# Patient Record
Sex: Female | Born: 1955
Health system: Southern US, Community
[De-identification: ages and names within clinical notes are randomized; demographics above are authoritative.]

## PROBLEM LIST (undated history)

## (undated) DIAGNOSIS — M8580 Other specified disorders of bone density and structure, unspecified site: Secondary | ICD-10-CM

## (undated) DIAGNOSIS — R079 Chest pain, unspecified: Secondary | ICD-10-CM

## (undated) DIAGNOSIS — E7800 Pure hypercholesterolemia, unspecified: Secondary | ICD-10-CM

## (undated) DIAGNOSIS — E559 Vitamin D deficiency, unspecified: Secondary | ICD-10-CM

## (undated) DIAGNOSIS — K219 Gastro-esophageal reflux disease without esophagitis: Secondary | ICD-10-CM

## (undated) DIAGNOSIS — I10 Essential (primary) hypertension: Secondary | ICD-10-CM

## (undated) DIAGNOSIS — M5416 Radiculopathy, lumbar region: Principal | ICD-10-CM

## (undated) HISTORY — DX: Vitamin D deficiency, unspecified: E55.9

## (undated) HISTORY — PX: TONSILLECTOMY: SUR1361

## (undated) HISTORY — PX: URETHRAL DIVERTICULUM REPAIR: SHX5148

## (undated) HISTORY — DX: Other specified disorders of bone density and structure, unspecified site: M85.80

## (undated) HISTORY — PX: TUBAL LIGATION: SHX77

## (undated) HISTORY — DX: Chest pain, unspecified: R07.9

## (undated) HISTORY — DX: Radiculopathy, lumbar region: M54.16

---

## 2010-10-16 LAB — HM DEXA SCAN

## 2012-01-16 ENCOUNTER — Emergency Department (INDEPENDENT_AMBULATORY_CARE_PROVIDER_SITE_OTHER): Payer: No Typology Code available for payment source

## 2012-01-16 ENCOUNTER — Encounter (HOSPITAL_BASED_OUTPATIENT_CLINIC_OR_DEPARTMENT_OTHER): Payer: Self-pay

## 2012-01-16 ENCOUNTER — Emergency Department (HOSPITAL_BASED_OUTPATIENT_CLINIC_OR_DEPARTMENT_OTHER)
Admission: EM | Admit: 2012-01-16 | Discharge: 2012-01-16 | Disposition: A | Discharge status: Home / Self Care | Payer: No Typology Code available for payment source | Attending: Emergency Medicine | Admitting: Emergency Medicine

## 2012-01-16 DIAGNOSIS — K219 Gastro-esophageal reflux disease without esophagitis: Secondary | ICD-10-CM | POA: Insufficient documentation

## 2012-01-16 DIAGNOSIS — S335XXA Sprain of ligaments of lumbar spine, initial encounter: Secondary | ICD-10-CM | POA: Insufficient documentation

## 2012-01-16 DIAGNOSIS — S39012A Strain of muscle, fascia and tendon of lower back, initial encounter: Secondary | ICD-10-CM

## 2012-01-16 DIAGNOSIS — E7800 Pure hypercholesterolemia, unspecified: Secondary | ICD-10-CM | POA: Insufficient documentation

## 2012-01-16 DIAGNOSIS — Y92410 Unspecified street and highway as the place of occurrence of the external cause: Secondary | ICD-10-CM | POA: Insufficient documentation

## 2012-01-16 DIAGNOSIS — M545 Low back pain, unspecified: Secondary | ICD-10-CM | POA: Insufficient documentation

## 2012-01-16 DIAGNOSIS — R935 Abnormal findings on diagnostic imaging of other abdominal regions, including retroperitoneum: Secondary | ICD-10-CM

## 2012-01-16 HISTORY — DX: Pure hypercholesterolemia, unspecified: E78.00

## 2012-01-16 HISTORY — DX: Gastro-esophageal reflux disease without esophagitis: K21.9

## 2012-01-16 MED ORDER — HYDROCODONE-ACETAMINOPHEN 5-500 MG PO TABS: 1.0000 | ORAL_TABLET | Freq: Four times a day (QID) | ORAL | Status: AC | PRN
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
## 2012-01-16 NOTE — Discharge Instructions (Signed)
Back Exercises Back exercises help treat and prevent back injuries. The goal of back exercises is to increase the strength of your abdominal and back muscles and the flexibility of your back. These exercises should be started when you no longer have back pain. Back exercises include:  Pelvic Tilt. Lie on your back with your knees bent. Tilt your pelvis until the lower part of your back is against the floor. Hold this position 5 to 10 sec and repeat 5 to 10 times.   Knee to Chest. Pull first 1 knee up against your chest and hold for 20 to 30 seconds, repeat this with the other knee, and then both knees. This may be done with the other leg straight or bent, whichever feels better.   Sit-Ups or Curl-Ups. Bend your knees 90 degrees. Start with tilting your pelvis, and do a partial, slow sit-up, lifting your trunk only 30 to 45 degrees off the floor. Take at least 2 to 3 seconds for each sit-up. Do not do sit-ups with your knees out straight. If partial sit-ups are difficult, simply do the above but with only tightening your abdominal muscles and holding it as directed.   Hip-Lift. Lie on your back with your knees flexed 90 degrees. Push down with your feet and shoulders as you raise your hips a couple inches off the floor; hold for 10 seconds, repeat 5 to 10 times.   Back arches. Lie on your stomach, propping yourself up on bent elbows. Slowly press on your hands, causing an arch in your low back. Repeat 3 to 5 times. Any initial stiffness and discomfort should lessen with repetition over time.   Shoulder-Lifts. Lie face down with arms beside your body. Keep hips and torso pressed to floor as you slowly lift your head and shoulders off the floor.  Do not overdo your exercises, especially in the beginning. Exercises may cause you some mild back discomfort which lasts for a few minutes; however, if the pain is more severe, or lasts for more than 15 minutes, do not continue exercises until you see your  caregiver. Improvement with exercise therapy for back problems is slow.  See your caregivers for assistance with developing a proper back exercise program. Document Released: 10/15/2004 Document Revised: 08/27/2011 Document Reviewed: 09/07/2005 [employer] Patient Information 2012 [employer]. 

## 2012-01-16 NOTE — ED Provider Notes (Signed)
Medical screening examination/treatment/procedure(s) were performed by non-physician practitioner and as supervising physician I was immediately available for consultation/collaboration.  Doug Sou, MD 01/16/12 1536
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
## 2012-06-08 ENCOUNTER — Emergency Department (HOSPITAL_BASED_OUTPATIENT_CLINIC_OR_DEPARTMENT_OTHER)
Admission: EM | Admit: 2012-06-08 | Discharge: 2012-06-08 | Disposition: A | Discharge status: Home / Self Care | Payer: Self-pay | Attending: Emergency Medicine | Admitting: Emergency Medicine

## 2012-06-08 ENCOUNTER — Encounter (HOSPITAL_BASED_OUTPATIENT_CLINIC_OR_DEPARTMENT_OTHER): Payer: Self-pay

## 2012-06-08 DIAGNOSIS — R188 Other ascites: Secondary | ICD-10-CM | POA: Insufficient documentation

## 2012-06-08 DIAGNOSIS — R0789 Other chest pain: Secondary | ICD-10-CM

## 2012-06-08 DIAGNOSIS — K219 Gastro-esophageal reflux disease without esophagitis: Secondary | ICD-10-CM | POA: Insufficient documentation

## 2012-06-08 LAB — CBC WITH DIFFERENTIAL/PLATELET
Eosinophils Relative: 1 % (ref 0–5)
HCT: 37.2 % (ref 36.0–46.0)
Hemoglobin: 12.5 g/dL (ref 12.0–15.0)
Lymphocytes Relative: 36 % (ref 12–46)
Lymphs Abs: 2.4 10*3/uL (ref 0.7–4.0)
MCV: 78.5 fL (ref 78.0–100.0)
Monocytes Absolute: 0.6 10*3/uL (ref 0.1–1.0)
Monocytes Relative: 9 % (ref 3–12)
Neutro Abs: 3.6 10*3/uL (ref 1.7–7.7)
RBC: 4.74 MIL/uL (ref 3.87–5.11)
RDW: 14.3 % (ref 11.5–15.5)
WBC: 6.6 10*3/uL (ref 4.0–10.5)

## 2012-06-08 LAB — BASIC METABOLIC PANEL
CO2: 26 mEq/L (ref 19–32)
Chloride: 104 mEq/L (ref 96–112)
Creatinine, Ser: 0.8 mg/dL (ref 0.50–1.10)
GFR calc Af Amer: >90 mL/min (ref >90)
Potassium: 3.9 mEq/L (ref 3.5–5.1)

## 2012-06-08 NOTE — ED Provider Notes (Signed)
History     CSN: [id number]  Arrival date & time 06/08/12  1403   First MD Initiated Contact with Patient 06/08/12 1503      Chief Complaint  Patient presents with  . Arm Pain    (Consider location/radiation/quality/duration/timing/severity/associated sxs/prior treatment) HPI  Complains of left arm pain left neck pain and left lateral chest pain upon awakening yesterday morning , described as a mild vague discomfort with " tired feeling"in her left arm discomfort has a pleuritic component, nonexertional not made better or worse by anything denies jaw pain denies shortness of breath nausea or sweatiness. Yesterday at 4 PM she had a sharp pain in her left neck lasting less than 1 minute. Pain is remained bag and minimal since 4 PM yesterday. Nothing makes symptoms better or worse. No treatment prior to coming here Past Medical History  Diagnosis Date  . GERD (gastroesophageal reflux disease)   . Hypercholesteremia    Cardiac risk factors hypercholesterolemia otherwise negative Past Surgical History  Procedure Date  . Urethral diverticulum repair     No family history on file.  History  Substance Use Topics  . Smoking status: Never Smoker   . Smokeless tobacco: Never Used  . Alcohol Use: No    OB History    Grav Para Term Preterm Abortions TAB SAB Ect Mult Living                  Review of Systems  Constitutional: Negative.   HENT: Positive for neck pain.   Respiratory: Negative.   Cardiovascular: Positive for chest pain.  Gastrointestinal: Negative.   Musculoskeletal: Positive for myalgias.  Skin: Negative.   Neurological: Negative.   Hematological: Negative.   Psychiatric/Behavioral: Negative.   All other systems reviewed and are negative.    Allergies  Review of patient's allergies indicates no known allergies.  Home Medications   Current Outpatient Rx  Name Route Sig Dispense Refill  . ZANTAC PO Oral Take by mouth.    Marland Kitchen CALCIUM 1200 PO Oral Take 1  capsule by mouth 3 (three) times daily.    Marland Kitchen VITAMIN D 1000 UNITS PO TABS Oral Take 1,000 Units by mouth 2 (two) times daily.    Marland Kitchen ESOMEPRAZOLE MAGNESIUM 20 MG PO CPDR Oral Take 20 mg by mouth at bedtime.    . OMEGA-3 FATTY ACIDS 1000 MG PO CAPS Oral Take 2 g by mouth 3 (three) times daily.    Marland Kitchen ONE-DAILY MULTI VITAMINS PO TABS Oral Take 1 tablet by mouth daily.    Marland Kitchen SIMVASTATIN 10 MG PO TABS Oral Take 5 mg by mouth at bedtime.      BP 132/48  Pulse 72  Temp 98.6 F (37 C) (Oral)  Resp 18  Ht 5' (1.524 m)  Wt 128 lb (58.06 kg)  BMI 25.00 kg/m2  SpO2 99%  Physical Exam  Nursing note and vitals reviewed. Constitutional: She is oriented to person, place, and time. She appears well-developed and well-nourished. No distress.  HENT:  Head: Normocephalic and atraumatic.  Eyes: Conjunctivae normal are normal. Pupils are equal, round, and reactive to light.  Neck: Neck supple. No tracheal deviation present. No thyromegaly present.       No JVD no bruit  Cardiovascular: Normal rate and regular rhythm.  Exam reveals no friction rub.   No murmur heard. Pulmonary/Chest: Effort normal and breath sounds normal.  Abdominal: Soft. Bowel sounds are normal. She exhibits no distension. There is no tenderness.  Musculoskeletal: Normal range of motion.  She exhibits no edema and no tenderness.  Lymphadenopathy:    She has no cervical adenopathy.  Neurological: She is alert and oriented to person, place, and time. Coordination normal.       Gait normal Romberg normal pronator drift normal. Motor strength 5 over 5 overall  Skin: Skin is warm and dry. No rash noted.  Psychiatric: She has a normal mood and affect.    ED Course  Procedures (including critical care time)  Labs Reviewed - No data to display No results found.   No diagnosis found.    Date: 06/08/2012  Rate: 60  Rhythm: sinus bradycardia  QRS Axis: normal  Intervals: normal  ST/T Wave abnormalities: nonspecific T wave changes   Conduction Disutrbances:none  Narrative Interpretation:   Old EKG Reviewed: none available  MDM   Doubt cardiac etiology i.e. highly atypical symptoms, near normal EKG negative troponin after greater than 24 hours of symptoms. Doubt pulmonary embolism no shortness of breath highly atypical symptoms negative d-dimer, doubt aortic dissection no pulse deficit atypical symptoms Spoke with Dr.Sprye plan  Lab results and EKG be faxed to her office patient to call office tomorrow for close followup  Diagnosis atypical chest pain     Doug Sou, MD 06/08/12 1608

## 2012-06-08 NOTE — ED Notes (Signed)
MD at bedside. 

## 2012-06-08 NOTE — ED Notes (Signed)
C/o pain to left upper back pain, left arm left side of jaw and neck-started yesterday at work

## 2012-06-08 NOTE — Discharge Instructions (Signed)
Call Dr. Carolyne Fiscal tomorrow to schedule the next available office appointment. Your test results have been faxed to her office. Return if concern for any reason between now and the time you see Dr. Carolyne Fiscal

## 2013-12-07 LAB — HM MAMMOGRAPHY: HM Mammogram: NORMAL

## 2014-09-21 LAB — HM PAP SMEAR: HM PAP: NORMAL

## 2015-10-14 ENCOUNTER — Emergency Department (HOSPITAL_BASED_OUTPATIENT_CLINIC_OR_DEPARTMENT_OTHER)
Admission: EM | Admit: 2015-10-14 | Discharge: 2015-10-14 | Disposition: A | Discharge status: Home / Self Care | Payer: BC Managed Care – PPO | Attending: Emergency Medicine | Admitting: Emergency Medicine

## 2015-10-14 ENCOUNTER — Encounter (HOSPITAL_BASED_OUTPATIENT_CLINIC_OR_DEPARTMENT_OTHER): Payer: Self-pay

## 2015-10-14 DIAGNOSIS — M545 Low back pain, unspecified: Secondary | ICD-10-CM

## 2015-10-14 DIAGNOSIS — E7800 Pure hypercholesterolemia, unspecified: Secondary | ICD-10-CM | POA: Diagnosis not present

## 2015-10-14 DIAGNOSIS — Z79899 Other long term (current) drug therapy: Secondary | ICD-10-CM | POA: Insufficient documentation

## 2015-10-14 DIAGNOSIS — K219 Gastro-esophageal reflux disease without esophagitis: Secondary | ICD-10-CM | POA: Diagnosis not present

## 2015-10-14 MED ORDER — NAPROXEN 500 MG PO TABS: 500.0000 mg | ORAL_TABLET | Freq: Two times a day (BID) | ORAL | Status: DC

## 2015-10-14 MED ORDER — OXYCODONE-ACETAMINOPHEN 5-325 MG PO TABS: 1.0000 | ORAL_TABLET | Freq: Four times a day (QID) | ORAL | Status: DC | PRN

## 2015-10-14 MED ORDER — DIAZEPAM 5 MG PO TABS
5.0000 mg | ORAL_TABLET | Freq: Once | ORAL | Status: AC
  Administered 2015-10-14: 5 mg via ORAL
  Filled 2015-10-14: qty 1

## 2015-10-14 MED ORDER — OXYCODONE-ACETAMINOPHEN 5-325 MG PO TABS
1.0000 | ORAL_TABLET | Freq: Once | ORAL | Status: AC
  Administered 2015-10-14: 1 via ORAL
  Filled 2015-10-14: qty 1

## 2015-10-14 MED ORDER — KETOROLAC TROMETHAMINE 30 MG/ML IJ SOLN
30.0000 mg | Freq: Once | INTRAMUSCULAR | Status: AC
  Administered 2015-10-14: 30 mg via INTRAMUSCULAR
  Filled 2015-10-14: qty 1

## 2015-10-14 MED ORDER — CYCLOBENZAPRINE HCL 5 MG PO TABS: 5.0000 mg | ORAL_TABLET | Freq: Three times a day (TID) | ORAL | Status: DC | PRN
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
## 2015-10-14 NOTE — Discharge Instructions (Signed)
Return to the ED with any concerns including weakness of legs, not able to urinate, loss of control of bowel or bladder, fever/chills, decreased level of alertness/lethargy, or any other alarming symptoms °

## 2015-10-14 NOTE — ED Provider Notes (Signed)
CSN: [id number]     Arrival date & time 10/14/15  1912 History   First MD Initiated Contact with Patient 10/14/15 2136     Chief Complaint  Patient presents with  . Back Pain     (Consider location/radiation/quality/duration/timing/severity/associated sxs/prior Treatment) HPI  Pt presenting with c/o low back pain.  Pt states pain began yesterday and was worse on the right side.  She used topical medication and laid down- when she got up today the pain was on the left side.  Worse with movement and palpation.  She has had left lower back pain for the past several months that her doctor attributed to repetitive movements.  The pain yesterday and today is more intense. No urinary retention or incontinence.  No weakness of legs.  Movement is restricted by pain.  No fever/chills.  There are no other associated systemic symptoms, there are no other alleviating or modifying factors.   Past Medical History  Diagnosis Date  . GERD (gastroesophageal reflux disease)   . Hypercholesteremia    Past Surgical History  Procedure Laterality Date  . Urethral diverticulum repair     No family history on file. Social History  Substance Use Topics  . Smoking status: Never Smoker   . Smokeless tobacco: Never Used  . Alcohol Use: No   OB History    No data available     Review of Systems  ROS reviewed and all otherwise negative except for mentioned in HPI    Allergies  Review of patient's allergies indicates no known allergies.  Home Medications   Prior to Admission medications   Medication Sig Start Date End Date Taking? Authorizing Provider  Calcium Carbonate-Vit D-Min (CALCIUM 1200 PO) Take 1 capsule by mouth 3 (three) times daily.   Yes Historical Provider, MD  cholecalciferol (VITAMIN D) 1000 UNITS tablet Take 1,000 Units by mouth 2 (two) times daily.   Yes Historical Provider, MD  esomeprazole (NEXIUM) 20 MG capsule Take 20 mg by mouth at bedtime.   Yes Historical Provider, MD  fish  oil-omega-3 fatty acids 1000 MG capsule Take 2 g by mouth 3 (three) times daily.   Yes Historical Provider, MD  Multiple Vitamin (MULTIVITAMIN) tablet Take 1 tablet by mouth daily.   Yes Historical Provider, MD  Ranitidine HCl (ZANTAC PO) Take 1 tablet by mouth daily as needed. For heartburn.   Yes Historical Provider, MD  simvastatin (ZOCOR) 10 MG tablet Take 5 mg by mouth at bedtime.   Yes Historical Provider, MD  cyclobenzaprine (FLEXERIL) 5 MG tablet Take 1 tablet (5 mg total) by mouth 3 (three) times daily as needed for muscle spasms. 10/14/15   Jerelyn Scott, MD  naproxen (NAPROSYN) 500 MG tablet Take 1 tablet (500 mg total) by mouth 2 (two) times daily. 10/14/15   Jerelyn Scott, MD  oxyCODONE-acetaminophen (PERCOCET/ROXICET) 5-325 MG tablet Take 1-2 tablets by mouth every 6 (six) hours as needed for severe pain. 10/14/15   Jerelyn Scott, MD  predniSONE (STERAPRED UNI-PAK 21 TAB) 10 MG (21) TBPK tablet Take 1 tablet (10 mg total) by mouth daily. Take 6 tabs by mouth daily  for 2 days, then 5 tabs for 2 days, then 4 tabs for 2 days, then 3 tabs for 2 days, 2 tabs for 2 days, then 1 tab by mouth daily for 2 days 10/17/15   Kristen N Ward, DO   BP 124/41 mmHg  Pulse 95  Temp(Src) 98.2 F (36.8 C) (Oral)  Resp 16  Ht 5' (1.524  m)  Wt 60.782 kg  BMI 26.17 kg/m2  SpO2 97%  Vitals reviewed Physical Exam  Physical Examination: General appearance - alert, well appearing, and in no distress Mental status - alert, oriented to person, place, and time Eyes -no conjunctival injection, no scleral icterus Chest - clear to auscultation, no wheezes, rales or rhonchi, symmetric air entry Heart - normal rate, regular rhythm, normal S1, S2, no murmurs, rubs, clicks or gallops Abdomen - soft, nontender, nondistended, no masses or organomegaly Back exam - no midline tenderness, ttp over left lower and left thoracic paraspinous muscles Neurological - alert, oriented, normal speech, strength 5/5 in lower  extremities bilaterally, sensation intact Extremities - peripheral pulses normal, no pedal edema, no clubbing or cyanosis Skin - normal coloration and turgor, no rashes  ED Course  Procedures (including critical care time) Labs Review Labs Reviewed - No data to display  Imaging Review No results found. I have personally reviewed and evaluated these images and lab results as part of my medical decision-making.   EKG Interpretation None      MDM   Final diagnoses:  Left-sided low back pain without sciatica    Pt presenting with c/o left sided low back pain.  Pain with movement and palpation.  No signs or symptoms of cauda equina, no fever to suggest epidural abscess.  No midline tenderness to palpation- imaging not indicated.  Pt feels improved after toradol injection, percocet and valium.  Pt given rx for flexeril, naproxen adn percocet for pain.  Discharged with strict return precautions.  Pt agreeable with plan.    Jerelyn Scott, MD 10/17/15 [phone]

## 2015-10-14 NOTE — ED Notes (Signed)
Left lower back pain for the last several months, worse today, saw PCP and was told it was from repetitive movements.  No dysuria, no n/v/d, pain worse with movement

## 2015-10-15 ENCOUNTER — Telehealth: Payer: Self-pay | Admitting: Family Medicine

## 2015-10-15 NOTE — Telephone Encounter (Signed)
Caller name: Wallace,Letitia A  Relation to pt: niece  Call back number:[phone]   Reason for call:  Wallace,Letitia A patient of yours referring her aunt to establish care with you, patient is aware your relocating. Please advise

## 2015-10-15 NOTE — Telephone Encounter (Signed)
Ok to establish 

## 2015-10-16 NOTE — Telephone Encounter (Signed)
Awaiting call back to schedule.

## 2015-10-16 NOTE — Telephone Encounter (Signed)
Pt has new pt appt scheduled w/ Ramon Dredge tomorrow (10/15/14). Is pt establishing w/ Dr. Beverely Low or Ramon Dredge? Please advise.

## 2015-10-16 NOTE — Telephone Encounter (Signed)
Patient was seen in the ED 10/14/2015 and was advised to follow up with a PCP. Dr. Beverely Low does not have any 30 minute appointment slots therefore scheduled with Ramon Dredge but Dr. Beverely Low will be her PCP.

## 2015-10-16 NOTE — Telephone Encounter (Signed)
Dr. Beverely Low approved seeing patient for ED follow up Friday at 11am.

## 2015-10-16 NOTE — Telephone Encounter (Signed)
Per Dr. Beverely Low: Pt can keep appt tomorrow w/ Ramon Dredge if she feels like she needs to be seen tomorrow, but please change appt type to ED f/u. If she sees [facility], she will still need to schedule New Patient appt w/ Dr. Beverely Low.  If patient is willing to wait until Friday, cancel appt w/ Ramon Dredge and schedule her for 30 min ED f/u w/ Dr. Beverely Low.

## 2015-10-17 ENCOUNTER — Emergency Department (HOSPITAL_BASED_OUTPATIENT_CLINIC_OR_DEPARTMENT_OTHER)
Admission: EM | Admit: 2015-10-17 | Discharge: 2015-10-17 | Disposition: A | Discharge status: Home / Self Care | Payer: BC Managed Care – PPO | Attending: Emergency Medicine | Admitting: Emergency Medicine

## 2015-10-17 ENCOUNTER — Encounter: Payer: Self-pay | Admitting: *Deleted

## 2015-10-17 ENCOUNTER — Ambulatory Visit: Payer: BC Managed Care – PPO | Admitting: Medical

## 2015-10-17 ENCOUNTER — Encounter (HOSPITAL_BASED_OUTPATIENT_CLINIC_OR_DEPARTMENT_OTHER): Payer: Self-pay

## 2015-10-17 ENCOUNTER — Telehealth: Payer: Self-pay | Admitting: *Deleted

## 2015-10-17 DIAGNOSIS — K219 Gastro-esophageal reflux disease without esophagitis: Secondary | ICD-10-CM | POA: Insufficient documentation

## 2015-10-17 DIAGNOSIS — Y92009 Unspecified place in unspecified non-institutional (private) residence as the place of occurrence of the external cause: Secondary | ICD-10-CM | POA: Diagnosis not present

## 2015-10-17 DIAGNOSIS — Y998 Other external cause status: Secondary | ICD-10-CM | POA: Insufficient documentation

## 2015-10-17 DIAGNOSIS — W92XXXA Exposure to excessive heat of man-made origin, initial encounter: Secondary | ICD-10-CM | POA: Diagnosis not present

## 2015-10-17 DIAGNOSIS — T2123XA Burn of second degree of upper back, initial encounter: Secondary | ICD-10-CM | POA: Insufficient documentation

## 2015-10-17 DIAGNOSIS — M5442 Lumbago with sciatica, left side: Secondary | ICD-10-CM | POA: Diagnosis not present

## 2015-10-17 DIAGNOSIS — E7800 Pure hypercholesterolemia, unspecified: Secondary | ICD-10-CM | POA: Insufficient documentation

## 2015-10-17 DIAGNOSIS — M545 Low back pain: Secondary | ICD-10-CM | POA: Diagnosis present

## 2015-10-17 DIAGNOSIS — Z79899 Other long term (current) drug therapy: Secondary | ICD-10-CM | POA: Insufficient documentation

## 2015-10-17 DIAGNOSIS — Y9389 Activity, other specified: Secondary | ICD-10-CM | POA: Insufficient documentation

## 2015-10-17 DIAGNOSIS — Z791 Long term (current) use of non-steroidal anti-inflammatories (NSAID): Secondary | ICD-10-CM | POA: Diagnosis not present

## 2015-10-17 DIAGNOSIS — T2124XA Burn of second degree of lower back, initial encounter: Secondary | ICD-10-CM

## 2015-10-17 MED ORDER — PREDNISONE 50 MG PO TABS
60.0000 mg | ORAL_TABLET | Freq: Once | ORAL | Status: AC
  Administered 2015-10-17: 60 mg via ORAL
  Filled 2015-10-17: qty 1

## 2015-10-17 MED ORDER — SILVER SULFADIAZINE 1 % EX CREA
TOPICAL_CREAM | Freq: Once | CUTANEOUS | Status: AC
  Administered 2015-10-17: 1 via TOPICAL
  Filled 2015-10-17: qty 85

## 2015-10-17 MED ORDER — PREDNISONE 10 MG (21) PO TBPK: 10.0000 mg | ORAL_TABLET | Freq: Every day | ORAL | Status: DC

## 2015-10-17 MED ORDER — KETOROLAC TROMETHAMINE 30 MG/ML IJ SOLN
30.0000 mg | Freq: Once | INTRAMUSCULAR | Status: AC
  Administered 2015-10-17: 30 mg via INTRAMUSCULAR
  Filled 2015-10-17: qty 1

## 2015-10-17 MED ORDER — MORPHINE SULFATE (PF) 4 MG/ML IV SOLN
4.0000 mg | Freq: Once | INTRAVENOUS | Status: AC
  Administered 2015-10-17: 4 mg via INTRAMUSCULAR
  Filled 2015-10-17: qty 1

## 2015-10-17 MED ORDER — OXYCODONE-ACETAMINOPHEN 5-325 MG PO TABS
2.0000 | ORAL_TABLET | Freq: Once | ORAL | Status: AC
  Administered 2015-10-17: 2 via ORAL
  Filled 2015-10-17: qty 2

## 2015-10-17 MED FILL — NAPROXEN 500 MG TABLET: 500 | 15 days supply | Qty: 30 | Fill #0

## 2015-10-17 MED FILL — CYCLOBENZAPRINE 5 MG TABLET: 5 | 7 days supply | Qty: 20 | Fill #0

## 2015-10-17 MED FILL — OXYCODONE/APAP 5-325: 5-325 | 2 days supply | Qty: 15 | Fill #0

## 2015-10-17 MED FILL — predniSONE 10 MG TABS: 10 | 12 days supply | Qty: 42 | Fill #0

## 2015-10-17 NOTE — ED Notes (Addendum)
Pt seen on Monday, dx'd with back pain, been going on for several weeks, states it is getting worse and radiating down her left leg, no incontinence, no groin numbness

## 2015-10-17 NOTE — Telephone Encounter (Signed)
Pre-Visit Call completed with patient and chart updated.   Pre-Visit Info documented in Specialty Comments under [employer].    
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
## 2015-10-17 NOTE — Telephone Encounter (Signed)
Unable to reach patient at time of pre-visit call. Left message for patient to return call when available.  

## 2015-10-18 ENCOUNTER — Ambulatory Visit (INDEPENDENT_AMBULATORY_CARE_PROVIDER_SITE_OTHER): Payer: BC Managed Care – PPO | Admitting: Family Medicine

## 2015-10-18 ENCOUNTER — Encounter: Payer: Self-pay | Admitting: Family Medicine

## 2015-10-18 VITALS — BP 118/80 | HR 95 | Temp 97.8°F | Ht 60.5 in | Wt 135.8 lb

## 2015-10-18 DIAGNOSIS — T2124XA Burn of second degree of lower back, initial encounter: Secondary | ICD-10-CM | POA: Diagnosis not present

## 2015-10-18 DIAGNOSIS — M5416 Radiculopathy, lumbar region: Secondary | ICD-10-CM

## 2015-10-18 HISTORY — DX: Radiculopathy, lumbar region: M54.16

## 2015-10-18 HISTORY — DX: Burn of second degree of lower back, initial encounter: T21.24XA

## 2015-10-18 MED ORDER — MUPIROCIN 2 % EX OINT: 1.0000 "application " | TOPICAL_OINTMENT | Freq: Two times a day (BID) | CUTANEOUS | Status: DC
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
## 2015-10-18 NOTE — Progress Notes (Signed)
Pre visit review using our clinic review tool, if applicable. No additional management support is needed unless otherwise documented below in the visit note. 
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
## 2015-10-19 ENCOUNTER — Encounter (HOSPITAL_COMMUNITY): Payer: Self-pay | Admitting: Emergency Medicine

## 2015-10-19 ENCOUNTER — Observation Stay (HOSPITAL_COMMUNITY)
Admission: EM | Admit: 2015-10-19 | Discharge: 2015-10-20 | Disposition: A | Discharge status: Home / Self Care | Payer: BC Managed Care – PPO | Attending: Internal Medicine | Admitting: Internal Medicine

## 2015-10-19 ENCOUNTER — Emergency Department (HOSPITAL_COMMUNITY): Payer: BC Managed Care – PPO

## 2015-10-19 DIAGNOSIS — D72829 Elevated white blood cell count, unspecified: Secondary | ICD-10-CM | POA: Diagnosis not present

## 2015-10-19 DIAGNOSIS — Z79899 Other long term (current) drug therapy: Secondary | ICD-10-CM | POA: Diagnosis not present

## 2015-10-19 DIAGNOSIS — E559 Vitamin D deficiency, unspecified: Secondary | ICD-10-CM | POA: Insufficient documentation

## 2015-10-19 DIAGNOSIS — M8580 Other specified disorders of bone density and structure, unspecified site: Secondary | ICD-10-CM | POA: Diagnosis not present

## 2015-10-19 DIAGNOSIS — R072 Precordial pain: Secondary | ICD-10-CM

## 2015-10-19 DIAGNOSIS — Z7952 Long term (current) use of systemic steroids: Secondary | ICD-10-CM | POA: Diagnosis not present

## 2015-10-19 DIAGNOSIS — Z791 Long term (current) use of non-steroidal anti-inflammatories (NSAID): Secondary | ICD-10-CM | POA: Insufficient documentation

## 2015-10-19 DIAGNOSIS — M5416 Radiculopathy, lumbar region: Secondary | ICD-10-CM | POA: Insufficient documentation

## 2015-10-19 DIAGNOSIS — R079 Chest pain, unspecified: Secondary | ICD-10-CM

## 2015-10-19 DIAGNOSIS — E785 Hyperlipidemia, unspecified: Secondary | ICD-10-CM | POA: Diagnosis not present

## 2015-10-19 DIAGNOSIS — T300 Burn of unspecified body region, unspecified degree: Secondary | ICD-10-CM

## 2015-10-19 DIAGNOSIS — K219 Gastro-esophageal reflux disease without esophagitis: Secondary | ICD-10-CM

## 2015-10-19 DIAGNOSIS — I10 Essential (primary) hypertension: Secondary | ICD-10-CM | POA: Insufficient documentation

## 2015-10-19 HISTORY — DX: Burn of unspecified body region, unspecified degree: T30.0

## 2015-10-19 HISTORY — DX: Elevated white blood cell count, unspecified: D72.829

## 2015-10-19 HISTORY — DX: Essential (primary) hypertension: I10

## 2015-10-19 HISTORY — DX: Chest pain, unspecified: R07.9

## 2015-10-19 HISTORY — DX: Hyperlipidemia, unspecified: E78.5

## 2015-10-19 LAB — BASIC METABOLIC PANEL
Anion gap: 9 (ref 5–15)
BUN: 17 mg/dL (ref 6–20)
CHLORIDE: 108 mmol/L (ref 101–111)
CO2: 26 mmol/L (ref 22–32)
Calcium: 8.9 mg/dL (ref 8.9–10.3)
Creatinine, Ser: 0.82 mg/dL (ref 0.44–1.00)
Glucose, Bld: 88 mg/dL (ref 65–99)
POTASSIUM: 4.7 mmol/L (ref 3.5–5.1)
SODIUM: 143 mmol/L (ref 135–145)

## 2015-10-19 LAB — TROPONIN I
Troponin I: <0.03 ng/mL (ref <0.031)
Troponin I: <0.03 ng/mL (ref <0.031)

## 2015-10-19 LAB — CBC
HEMATOCRIT: 35.2 % — AB (ref 36.0–46.0)
Hemoglobin: 11.4 g/dL — ABNORMAL LOW (ref 12.0–15.0)
MCH: 26.3 pg (ref 26.0–34.0)
MCHC: 32.4 g/dL (ref 30.0–36.0)
MCV: 81.3 fL (ref 78.0–100.0)
PLATELETS: 238 10*3/uL (ref 150–400)
RBC: 4.33 MIL/uL (ref 3.87–5.11)
RDW: 14.3 % (ref 11.5–15.5)
WBC: 14 10*3/uL — AB (ref 4.0–10.5)

## 2015-10-19 LAB — I-STAT TROPONIN, ED
TROPONIN I, POC: 0 ng/mL (ref 0.00–0.08)
Troponin i, poc: 0 ng/mL (ref 0.00–0.08)

## 2015-10-19 LAB — PROTIME-INR
INR: 1.09 (ref 0.00–1.49)
Prothrombin Time: 14.3 seconds (ref 11.6–15.2)

## 2015-10-19 LAB — APTT: APTT: 29 s (ref 24–37)

## 2015-10-19 MED ORDER — PREDNISONE 10 MG PO TABS
10.0000 mg | ORAL_TABLET | Freq: Every day | ORAL | Status: DC
  Administered 2015-10-19 – 2015-10-20 (×2): 10 mg via ORAL
  Filled 2015-10-19 (×2): qty 1

## 2015-10-19 MED ORDER — CYCLOBENZAPRINE HCL 10 MG PO TABS: 5.0000 mg | ORAL_TABLET | Freq: Three times a day (TID) | ORAL | Status: DC | PRN

## 2015-10-19 MED ORDER — ATORVASTATIN CALCIUM 80 MG PO TABS
80.0000 mg | ORAL_TABLET | Freq: Every day | ORAL | Status: DC
  Administered 2015-10-19: 80 mg via ORAL
  Filled 2015-10-19: qty 1

## 2015-10-19 MED ORDER — ENOXAPARIN SODIUM 40 MG/0.4ML ~~LOC~~ SOLN
40.0000 mg | SUBCUTANEOUS | Status: DC
  Administered 2015-10-19: 40 mg via SUBCUTANEOUS
  Filled 2015-10-19: qty 0.4

## 2015-10-19 MED ORDER — NITROGLYCERIN 0.4 MG SL SUBL
0.4000 mg | SUBLINGUAL_TABLET | SUBLINGUAL | Status: DC | PRN
  Administered 2015-10-19 (×2): 0.4 mg via SUBLINGUAL
  Filled 2015-10-19: qty 1

## 2015-10-19 MED ORDER — MORPHINE SULFATE (PF) 2 MG/ML IV SOLN: 2.0000 mg | INTRAVENOUS | Status: DC | PRN

## 2015-10-19 MED ORDER — GI COCKTAIL ~~LOC~~: 30.0000 mL | Freq: Four times a day (QID) | ORAL | Status: DC | PRN

## 2015-10-19 MED ORDER — ASPIRIN EC 81 MG PO TBEC
81.0000 mg | DELAYED_RELEASE_TABLET | Freq: Every day | ORAL | Status: DC
  Administered 2015-10-20: 81 mg via ORAL
  Filled 2015-10-19: qty 1

## 2015-10-19 MED ORDER — PREDNISONE 10 MG (21) PO TBPK: 10.0000 mg | ORAL_TABLET | Freq: Every day | ORAL | Status: DC

## 2015-10-19 MED ORDER — ACETAMINOPHEN 325 MG PO TABS: 650.0000 mg | ORAL_TABLET | ORAL | Status: DC | PRN

## 2015-10-19 MED ORDER — OXYCODONE-ACETAMINOPHEN 5-325 MG PO TABS: 1.0000 | ORAL_TABLET | Freq: Four times a day (QID) | ORAL | Status: DC | PRN

## 2015-10-19 MED ORDER — FAMOTIDINE 20 MG PO TABS: 20.0000 mg | ORAL_TABLET | Freq: Every day | ORAL | Status: DC | PRN

## 2015-10-19 MED ORDER — MUPIROCIN 2 % EX OINT
1.0000 "application " | TOPICAL_OINTMENT | Freq: Two times a day (BID) | CUTANEOUS | Status: DC
  Administered 2015-10-20: 1 via TOPICAL
  Filled 2015-10-19: qty 22

## 2015-10-19 MED ORDER — ONDANSETRON HCL 4 MG/2ML IJ SOLN: 4.0000 mg | Freq: Four times a day (QID) | INTRAMUSCULAR | Status: DC | PRN

## 2015-10-19 MED ORDER — SIMVASTATIN 10 MG PO TABS: 5.0000 mg | ORAL_TABLET | Freq: Every day | ORAL | Status: DC

## 2015-10-19 NOTE — ED Provider Notes (Signed)
Medical screening examination/treatment/procedure(s) were conducted as a shared visit with non-physician practitioner(s) and myself.  I personally evaluated the patient during the encounter.   EKG Interpretation   Date/Time:  Saturday October 19 2015 10:58:44 EST Ventricular Rate:  61 PR Interval:  138 QRS Duration: 82 QT Interval:  343 QTC Calculation: 345 R Axis:   76 Text Interpretation:  Sinus rhythm Nonspecific T abnrm, anterolateral  leads Confirmed by Lakeena Downie  MD, Brynja Marker ([id number]) on 10/19/2015 11:19:41 AM       Results for orders placed or performed during the hospital encounter of 10/19/15  Basic metabolic panel  Result Value Ref Range   Sodium 143 135 - 145 mmol/L   Potassium 4.7 3.5 - 5.1 mmol/L   Chloride 108 101 - 111 mmol/L   CO2 26 22 - 32 mmol/L   Glucose, Bld 88 65 - 99 mg/dL   BUN 17 6 - 20 mg/dL   Creatinine, Ser 1.61 0.44 - 1.00 mg/dL   Calcium 8.9 8.9 - 09.6 mg/dL   GFR calc non Af Amer >60 >60 mL/min   GFR calc Af Amer >60 >60 mL/min   Anion gap 9 5 - 15  CBC  Result Value Ref Range   WBC 14.0 (H) 4.0 - 10.5 K/uL   RBC 4.33 3.87 - 5.11 MIL/uL   Hemoglobin 11.4 (L) 12.0 - 15.0 g/dL   HCT 04.5 (L) 40.9 - 81.1 %   MCV 81.3 78.0 - 100.0 fL   MCH 26.3 26.0 - 34.0 pg   MCHC 32.4 30.0 - 36.0 g/dL   RDW 91.4 78.2 - 95.6 %   Platelets 238 150 - 400 K/uL  I-stat troponin, ED (not at [facility], [facility])  Result Value Ref Range   Troponin i, poc 0.00 0.00 - 0.08 ng/mL   Comment 3           Dg Chest 2 View  10/19/2015  CLINICAL DATA:  60 year old female with acute chest pain today. EXAM: CHEST  2 VIEW COMPARISON:  None. FINDINGS: The cardiomediastinal silhouette is unremarkable. There is no evidence of focal airspace disease, pulmonary edema, suspicious pulmonary nodule/mass, pleural effusion, or pneumothorax. No acute bony abnormalities are identified. IMPRESSION: No active cardiopulmonary disease. Electronically Signed   By: Harmon Pier M.D.   On: 10/19/2015 12:23     Patient seen by me. Patient with 2 episodes of substernal chest pain yesterday that lasted 3-5 minutes. This morning the pain reoccurred at 9 in the morning and has not gone away completely since then. But this time the pain was more right side and radiated into the right jaw. Patient describes it as pressure and sometimes more sharp pain. The sharp pains have been intermittent but since 9:00 this morning she's had constant discomfort. Workup first troponin negative no significant anemia no significant electrolyte abnormalities. Chest x-rays negative for any active cardiopulmonary disease no pneumonia no pneumothorax. EKG without any acute changes other some nonspecific T-wave abnormalities. Patient on a hard score of comes in at 85. Claris Gower is at moderate risk and that on top of the persistent pain since 9 this morning we'll mandates admission for rule out. On exam heart regular rate and rhythm lungs are clear bilaterally chest is nontender abdomen is nontender.  Vanetta Mulders, MD 10/19/15 1423

## 2015-10-19 NOTE — ED Notes (Addendum)
To ED via [facility] from home with c/o chest pain that started at around midnight , intermittent, states "feels like someone is standing on me" . No nausea/no vomiting.  Pt received ASA  and NTG x1

## 2015-10-19 NOTE — ED Notes (Signed)
Pain is still 3/10 --

## 2015-10-19 NOTE — H&P (Signed)
[facility] History and Physical  Connie Gross WUJ:[id number] DOB: 04-03-1956 DOA: 10/19/2015  Referring physician: Emergency Department PCP: Neena Rhymes, MD   CHIEF COMPLAINT:   Chest pain   HPI: Connie Gross is a 60 y.o. female, relatively healthy, presenting to the emergency department today for chest pain. Over the last year patient has had several episodes of nonexertional, nonradiating chest pain. Pain described as burning . Episodes last usually 5-10 minutes and resolve spontaneously. Patient works in her large yard and does other physical activity without any occurrences of chest pain or shortness of breath. Episodes always occur at rest.   Yesterday patient developed recurrent chest pain different than previous episodes. This time the chest pain was more of a pressure with radiation up into right jaw. No radiation down arms. No associated diaphoresis or shortness of breath . Patient saw her PCP yesterday , note is pending but per patient an EKG was done and did show some slight changes. Patient was referred to a cardiologist in [facility] Feb 4th.  Patient does have a history of GERD, she is on an H2 blocker.    Chest pain not totally resolved, maybe 4/10 right now. Got sl NTG 30 minutes ago. Second dose just being given now   ED COURSE:   Given NTG SL x 2       Labs:   Troponin 0.0 x2,  normal renal function WBC 14, hemoglobin 11.4  CXR:  No acute abnormalities           EKG:    Sinus rhythm Nonspecific T abnrm, anterolateral leads                  Medications  nitroGLYCERIN (NITROSTAT) SL tablet 0.4 mg (0.4 mg Sublingual Given 10/19/15 1505)    Review of Systems  Constitutional: Negative.   HENT: Negative.   Eyes: Negative.   Respiratory: Negative.   Cardiovascular: Positive for chest pain.  Gastrointestinal: Negative.   Genitourinary: Negative.   Musculoskeletal: Negative.   Skin: Negative.   Neurological: Negative.   Psychiatric/Behavioral:  Negative.    Past Medical History  Diagnosis Date  . Hypercholesteremia   . Vitamin D deficiency   . Osteopenia   . GERD (gastroesophageal reflux disease)   . Lumbar radiculopathy 10/18/2015   Past Surgical History  Procedure Laterality Date  . Urethral diverticulum repair    . Tubal ligation    . Tonsillectomy      SOCIAL HISTORY:  reports that she has never smoked. She has never used smokeless tobacco. She reports that she does not drink alcohol or use illicit drugs. Lives: at home along     Assistive devices:   None needed for ambulation.   No Known Allergies  Family History  Problem Relation Age of Onset  . Heart disease Father   . Alzheimer's disease Father   . Cancer Sister   . Hodgkin's lymphoma Sister   . Stomach cancer Maternal Grandmother   . Osteoporosis Brother     Prior to Admission medications   Medication Sig Start Date End Date Taking? Authorizing Provider  Calcium Carbonate-Vit D-Min (CALCIUM 1200 PO) Take 1 capsule by mouth 3 (three) times daily.   Yes Historical Provider, MD  cholecalciferol (VITAMIN D) 1000 UNITS tablet Take 1,000 Units by mouth 2 (two) times daily.   Yes Historical Provider, MD  cyclobenzaprine (FLEXERIL) 5 MG tablet Take 1 tablet (5 mg total) by mouth 3 (three) times daily as needed for muscle spasms. 10/14/15  Yes Jerelyn Scott, MD  fish oil-omega-3 fatty acids 1000 MG capsule Take 2 g by mouth daily as needed.    Yes Historical Provider, MD  Multiple Vitamin (MULTIVITAMIN) tablet Take 1 tablet by mouth daily. Reported on 10/17/2015   Yes Historical Provider, MD  mupirocin ointment (BACTROBAN) 2 % Apply 1 application topically 2 (two) times daily. 10/18/15  Yes Sheliah Hatch, MD  naproxen (NAPROSYN) 500 MG tablet Take 1 tablet (500 mg total) by mouth 2 (two) times daily. 10/14/15  Yes Jerelyn Scott, MD  oxyCODONE-acetaminophen (PERCOCET/ROXICET) 5-325 MG tablet Take 1-2 tablets by mouth every 6 (six) hours as needed for severe pain.  10/14/15  Yes Jerelyn Scott, MD  predniSONE (STERAPRED UNI-PAK 21 TAB) 10 MG (21) TBPK tablet Take 1 tablet (10 mg total) by mouth daily. Take 6 tabs by mouth daily  for 2 days, then 5 tabs for 2 days, then 4 tabs for 2 days, then 3 tabs for 2 days, 2 tabs for 2 days, then 1 tab by mouth daily for 2 days 10/17/15  Yes Kristen N Ward, DO  Ranitidine HCl (ZANTAC PO) Take 1 tablet by mouth daily as needed. For heartburn.   Yes Historical Provider, MD  simvastatin (ZOCOR) 10 MG tablet Take 5 mg by mouth at bedtime.   Yes Historical Provider, MD   PHYSICAL EXAM: Filed Vitals:   10/19/15 1055 10/19/15 1245 10/19/15 1343 10/19/15 1445  BP: 147/70 126/66 129/79 133/69  Pulse: 93 64 75 79  Temp: 98.5 F (36.9 C)     TempSrc: Oral     Resp: Height: 5' (1.524 m)     Weight: 61.236 kg (135 lb)     SpO2: 98% 100% 100% 100%    Wt Readings from Last 3 Encounters:  10/19/15 61.236 kg (135 lb)  10/18/15 61.598 kg (135 lb 12.8 oz)  10/14/15 60.782 kg (134 lb)    General:  Pleasant black  female. Appears calm and comfortable Eyes: PER, normal lids, irises & conjunctiva ENT: grossly normal hearing, lips & tongue Neck: no LAD, no masses Cardiovascular: RRR, no murmurs. No LE edema.  Respiratory: Respirations even and unlabored. Normal respiratory effort. Lungs CTA bilaterally, no wheezes / rales .   Abdomen: soft, non-distended, non-tender, active bowel sounds. No obvious masses.  Skin: no rash seen on limited exam. Several small burns in mid back. No drainage or significant erythema Musculoskeletal: grossly normal tone BUE/BLE Psychiatric: grossly normal mood and affect, speech fluent and appropriate Neurologic: grossly non-focal.         LABS ON ADMISSION:    Basic Metabolic Panel:  Recent Labs Lab 10/19/15 1135  NA 143  K 4.7  CL 108  CO2 26  GLUCOSE 88  BUN 17  CREATININE 0.82  CALCIUM 8.9    CBC:  Recent Labs Lab 10/19/15 1135  WBC 14.0*  HGB 11.4*  HCT 35.2*    MCV 81.3  PLT 238   CREATININE: 0.82 (10/19/15 1135) Estimated creatinine clearance - 58.9 mL/min  Radiological Exams on Admission: Dg Chest 2 View  10/19/2015  CLINICAL DATA:  60 year old female with acute chest pain today. EXAM: CHEST  2 VIEW COMPARISON:  None. FINDINGS: The cardiomediastinal silhouette is unremarkable. There is no evidence of focal airspace disease, pulmonary edema, suspicious pulmonary nodule/mass, pleural effusion, or pneumothorax. No acute bony abnormalities are identified. IMPRESSION: No active cardiopulmonary disease. Electronically Signed   By: Harmon Pier M.D.   On: 10/19/2015 12:23  ASSESSMENT / PLAN    Chest pain. Heart score 4. Initial troponin 0.0. Nonspecific T-wave abnormalities on EKG. Cardiac etiology not excluded. This could also be GERD. Musculoskeletal seems unlikely. Noting to suggest pulmonary etiology -Admit to observation - telemetry bed  -Cycle trops, first 2 are normal -echocardiogram -GI cocktail prn  Back pain. Evaluated in ED twice in last week. Started on Prednisone two days ago.  -Continue course of prednisone as prescribed in ED 10/17/15 -continue flexeril and Percocet as prescribed in ED 10/13/68  Leukocytosis, WBC 14. Etiology unclear. On prednisone for 2 days.   -Obtain urinalysis, -Repeat CBC in a.m.  Superficial skin burns to mid back, evaluated in ED for this two days ago. Fell asleep with heating pad. No drainage or significant erythema -Will ask wound care to evaluate and recommend dressing.    Hyperlipidemia.  -Continue home zocor  GERD, on H2 blocker -continue H2 blocker  CONSULTANTS:   None  Code Status:  Full code DVT Prophylaxis: Lovenox Family Communication:  Patient alert, oriented and understands plan of care.  Disposition Plan: Discharge to home in 24 hours   Time spent: 60 minutes Willette Cluster  NP [facility] Pager [phone]

## 2015-10-19 NOTE — ED Notes (Signed)
Pt having chest pain-- midsternal-- "feels like I need to belch" 4/10

## 2015-10-19 NOTE — ED Provider Notes (Signed)
CSN: [id number]     Arrival date & time 10/19/15  1054 History   First MD Initiated Contact with Patient 10/19/15 1100     Chief Complaint  Patient presents with  . Chest Pain   Connie Gross is a 60 y.o. female with a history of hyperlipidemia who presents to the emergency department complaining of intermittent chest pain since yesterday. Patient reports she's been having substernal chest pressure intermittently since 5:30 PM yesterday. She reports she has episodes lasting approximately 45 minutes then spontaneously resolve. She reports around 9:30 this morning she was sitting in her chair when she had chest pressure that radiated up to her right jaw lasting approximately 4 minutes. She reports she currently has chest heaviness in the center of her chest. She denies any pain currently. She complains of chest discomfort and has had this constantly since 9:30 this morning. She denies shortness of breath or palpitations. EMS provided her with 324 mg of aspirin and one round of nitroglycerin. She reports some relief with this medicine. She denies personal or close family history of MI. She denies personal or close family history of DVTs or PEs. She is not a smoker. She denies history of hypertension. She denies fevers, chills, leg pain, leg swelling, coughing, wheezing, SOB, abdominal pain, nausea, vomiting, recent long travel, endogenous estrogen use, or rashes.   (Consider location/radiation/quality/duration/timing/severity/associated sxs/prior Treatment) HPI  Past Medical History  Diagnosis Date  . Hypercholesteremia   . Vitamin D deficiency   . Osteopenia   . GERD (gastroesophageal reflux disease)   . Lumbar radiculopathy 10/18/2015   Past Surgical History  Procedure Laterality Date  . Urethral diverticulum repair    . Tubal ligation    . Tonsillectomy     Family History  Problem Relation Age of Onset  . Heart disease Father   . Alzheimer's disease Father   . Cancer Sister   . Hodgkin's  lymphoma Sister   . Stomach cancer Maternal Grandmother   . Osteoporosis Brother    Social History  Substance Use Topics  . Smoking status: Never Smoker   . Smokeless tobacco: Never Used  . Alcohol Use: No   OB History    No data available     Review of Systems  Constitutional: Negative for fever and chills.  HENT: Negative for congestion and sore throat.   Eyes: Negative for visual disturbance.  Respiratory: Negative for cough, shortness of breath and wheezing.   Cardiovascular: Positive for chest pain. Negative for palpitations and leg swelling.  Gastrointestinal: Negative for nausea, vomiting, abdominal pain and diarrhea.  Genitourinary: Negative for dysuria.  Musculoskeletal: Negative for neck pain.  Skin: Negative for rash.  Neurological: Negative for syncope, weakness, light-headedness, numbness and headaches.      Allergies  Review of patient's allergies indicates no known allergies.  Home Medications   Prior to Admission medications   Medication Sig Start Date End Date Taking? Authorizing Provider  Calcium Carbonate-Vit D-Min (CALCIUM 1200 PO) Take 1 capsule by mouth 3 (three) times daily.   Yes Historical Provider, MD  cholecalciferol (VITAMIN D) 1000 UNITS tablet Take 1,000 Units by mouth 2 (two) times daily.   Yes Historical Provider, MD  cyclobenzaprine (FLEXERIL) 5 MG tablet Take 1 tablet (5 mg total) by mouth 3 (three) times daily as needed for muscle spasms. 10/14/15  Yes Jerelyn Scott, MD  fish oil-omega-3 fatty acids 1000 MG capsule Take 2 g by mouth daily as needed.    Yes Historical Provider, MD  Multiple  Vitamin (MULTIVITAMIN) tablet Take 1 tablet by mouth daily. Reported on 10/17/2015   Yes Historical Provider, MD  mupirocin ointment (BACTROBAN) 2 % Apply 1 application topically 2 (two) times daily. 10/18/15  Yes Sheliah Hatch, MD  naproxen (NAPROSYN) 500 MG tablet Take 1 tablet (500 mg total) by mouth 2 (two) times daily. 10/14/15  Yes Jerelyn Scott,  MD  oxyCODONE-acetaminophen (PERCOCET/ROXICET) 5-325 MG tablet Take 1-2 tablets by mouth every 6 (six) hours as needed for severe pain. 10/14/15  Yes Jerelyn Scott, MD  predniSONE (STERAPRED UNI-PAK 21 TAB) 10 MG (21) TBPK tablet Take 1 tablet (10 mg total) by mouth daily. Take 6 tabs by mouth daily  for 2 days, then 5 tabs for 2 days, then 4 tabs for 2 days, then 3 tabs for 2 days, 2 tabs for 2 days, then 1 tab by mouth daily for 2 days 10/17/15  Yes Kristen N Ward, DO  Ranitidine HCl (ZANTAC PO) Take 1 tablet by mouth daily as needed. For heartburn.   Yes Historical Provider, MD  simvastatin (ZOCOR) 10 MG tablet Take 5 mg by mouth at bedtime.   Yes Historical Provider, MD   BP 133/69 mmHg  Pulse 79  Temp(Src) 98.5 F (36.9 C) (Oral)  Resp 22  Ht 5' (1.524 m)  Wt 61.236 kg  BMI 26.37 kg/m2  SpO2 100% Physical Exam  Constitutional: She is oriented to person, place, and time. She appears well-developed and well-nourished. No distress.  Nontoxic appearing.  HENT:  Head: Normocephalic and atraumatic.  Right Ear: External ear normal.  Left Ear: External ear normal.  Mouth/Throat: Oropharynx is clear and moist.  Eyes: Conjunctivae are normal. Pupils are equal, round, and reactive to light. Right eye exhibits no discharge. Left eye exhibits no discharge.  Neck: Normal range of motion. Neck supple. No JVD present. No tracheal deviation present.  Cardiovascular: Normal rate, regular rhythm, normal heart sounds and intact distal pulses.  Exam reveals no gallop and no friction rub.   No murmur heard. Bilateral radial, posterior tibialis and dorsalis pedis pulses are intact.    Pulmonary/Chest: Effort normal and breath sounds normal. No respiratory distress. She has no wheezes. She has no rales. She exhibits no tenderness.  Lungs clear to auscultation bilaterally. Chest wall is nontender to palpation.  Abdominal: Soft. She exhibits no distension. There is no tenderness. There is no guarding.   Musculoskeletal: She exhibits no edema or tenderness.  No lower extremity edema or tenderness.  Lymphadenopathy:    She has no cervical adenopathy.  Neurological: She is alert and oriented to person, place, and time. Coordination normal.  Skin: Skin is warm and dry. No rash noted. She is not diaphoretic. No erythema. No pallor.  Healing burns noted to her bilateral low back from heating pad. No evidence of infection.  Psychiatric: She has a normal mood and affect. Her behavior is normal.  Nursing note and vitals reviewed.   ED Course  Procedures (including critical care time) Labs Review Labs Reviewed  CBC - Abnormal; Notable for the following:    WBC 14.0 (*)    Hemoglobin 11.4 (*)    HCT 35.2 (*)    All other components within normal limits  BASIC METABOLIC PANEL  I-STAT TROPOININ, ED  Rosezena Sensor, ED    Imaging Review Dg Chest 2 View  10/19/2015  CLINICAL DATA:  60 year old female with acute chest pain today. EXAM: CHEST  2 VIEW COMPARISON:  None. FINDINGS: The cardiomediastinal silhouette is unremarkable. There is  no evidence of focal airspace disease, pulmonary edema, suspicious pulmonary nodule/mass, pleural effusion, or pneumothorax. No acute bony abnormalities are identified. IMPRESSION: No active cardiopulmonary disease. Electronically Signed   By: Harmon Pier M.D.   On: 10/19/2015 12:23   I have personally reviewed and evaluated these images and lab results as part of my medical decision-making.   EKG Interpretation   Date/Time:  Saturday October 19 2015 10:58:44 EST Ventricular Rate:  61 PR Interval:  138 QRS Duration: 82 QT Interval:  343 QTC Calculation: 345 R Axis:   76 Text Interpretation:  Sinus rhythm Nonspecific T abnrm, anterolateral  leads Confirmed by [email or web address]  MD, SCOTT ([id number]) on 10/19/2015 11:19:41 AM      Filed Vitals:   10/19/15 1055 10/19/15 1245 10/19/15 1343 10/19/15 1445  BP: 147/70 126/66 129/79 133/69  Pulse: 93 64 75 79  Temp:  98.5 F (36.9 C)     TempSrc: Oral     Resp: Height: 5' (1.524 m)     Weight: 61.236 kg     SpO2: 98% 100% 100% 100%     MDM   Final diagnoses:  Chest pain, unspecified chest pain type   This is a 60 y.o. female with a history of hyperlipidemia who presents to the emergency department complaining of intermittent chest pain since yesterday. Patient reports she's been having substernal chest pressure intermittently since 5:30 PM yesterday. She reports she has episodes lasting approximately 45 minutes then spontaneously resolve. She reports around 9:30 this morning she was sitting in her chair when she had chest pressure that radiated up to her right jaw lasting approximately 4 minutes. She reports she currently has chest heaviness in the center of her chest. She denies any pain currently. She complains of chest discomfort and has had this constantly since 9:30 this morning. EMS provided her with 324 mg of aspirin and one round of nitroglycerin. On exam the patient is afebrile nontoxic appearing. EKG shows sinus rhythm with nonspecific T-wave abnormalities. No STEMI on EKG. Lungs clear to auscultation bilaterally. No chest tenderness to palpation. No lower extremity edema or tenderness. She is not tachypenic, tachycardic or hypoxic.  Initial troponin is negative. BMP is within normal limits. CBC is remarkable for a leukocytosis of 14,000 and a stable hemoglobin. Patient is on prednisone for her previous back pain. This could contribute to her elevated WBC. Chest x-ray is unremarkable. HEART Score is 4. After discussion with my attending, Dr. Deretha Emory, will admit for ACS rule out. The patient is in agreement with admission. I counseled for admission with NP Gunnar Fusi who accepted the patient for admission. She requested temporary admission orders for telemetry bed.  Later, the patient's chest pain returned. She complained of 4/10 pain. Repeat EKG shows no changes. Will check troponin and  provide with nitroglycerin.  At reevaluation hospitalist team is in the room working on admission.    This patient was discussed with and evaluated by Dr. Deretha Emory who agrees with assessment and plan.    Everlene Farrier, PA-C 10/19/15 1533

## 2015-10-19 NOTE — Consult Note (Signed)
Referring Physician: " Dr. Malachi Bonds" Primary Physician: Primary Cardiologist: Reason for Consultation: "chest pain"   HPI: 60 y/o african american woman with pmh of hyperlipidemia admitted in hospital for evaluation of chest pain and cardiology consulted for chest pain. She complaints of intermittent substernal chest pressure and radiating to her jaw. Started on Friday , 5 minutes in duration got worse on Saturday so came to ed. No prior cad. Ex smoker. Trop x 3 negative in hospital Currently chest pain free. She was scheduled to get outpt stress test.   Review of Systems:     Cardiac Review of Systems: {Y] = yes  = no  Chest Pain [place]    ]  Resting SOB [   ] Exertional SOB  [  ]  Orthopnea [  ]   Pedal Edema [   ]    Palpitations [  ] Syncope  [  ]   Presyncope [   ]  General Review of Systems: [Y] = yes [  ]=no Constitional: recent weight change [  ]; anorexia [  ]; fatigue [  ]; nausea [  ]; night sweats [  ]; fever [  ]; or chills [  ];                                                                     Eyes : blurred vision [  ]; diplopia [   ]; vision changes [  ];  Amaurosis fugax[  ]; Resp: cough [  ];  wheezing[  ];  hemoptysis[  ];  PND [  ];  GI:  gallstones[  ], vomiting[  ];  dysphagia[  ]; melena[  ];  hematochezia [  ]; heartburn[  ];   GU: kidney stones [  ]; hematuria[  ];   dysuria [  ];  nocturia[  ]; incontinence [  ];             Skin: rash, swelling[  ];, hair loss[  ];  peripheral edema[  ];  or itching[  ]; Musculosketetal: myalgias[  ];  joint swelling[  ];  joint erythema[  ];  joint pain[  ];  back pain[  ];  Heme/Lymph: bruising[  ];  bleeding[  ];  anemia[  ];  Neuro: TIA[  ];  headaches[  ];  stroke[  ];  vertigo[  ];  seizures[  ];   paresthesias[  ];  difficulty walking[  ];  Psych:depression[  ]; anxiety[  ];  Endocrine: diabetes[  ];  thyroid dysfunction[  ];  Other:  Past Medical History  Diagnosis Date  . Hypercholesteremia   . Vitamin D  deficiency   . Osteopenia   . GERD (gastroesophageal reflux disease)   . Lumbar radiculopathy 10/18/2015    Medications Prior to Admission  Medication Sig Dispense Refill  . Calcium Carbonate-Vit D-Min (CALCIUM 1200 PO) Take 1 capsule by mouth 3 (three) times daily.    . cholecalciferol (VITAMIN D) 1000 UNITS tablet Take 1,000 Units by mouth 2 (two) times daily.    . cyclobenzaprine (FLEXERIL) 5 MG tablet Take 1 tablet (5 mg total) by mouth 3 (three) times daily as needed for muscle spasms. 20 tablet 0  . fish oil-omega-3  fatty acids 1000 MG capsule Take 2 g by mouth daily as needed.     . Multiple Vitamin (MULTIVITAMIN) tablet Take 1 tablet by mouth daily. Reported on 10/17/2015    . mupirocin ointment (BACTROBAN) 2 % Apply 1 application topically 2 (two) times daily. 30 g 1  . naproxen (NAPROSYN) 500 MG tablet Take 1 tablet (500 mg total) by mouth 2 (two) times daily. 30 tablet 0  . oxyCODONE-acetaminophen (PERCOCET/ROXICET) 5-325 MG tablet Take 1-2 tablets by mouth every 6 (six) hours as needed for severe pain. 15 tablet 0  . predniSONE (STERAPRED UNI-PAK 21 TAB) 10 MG (21) TBPK tablet Take 1 tablet (10 mg total) by mouth daily. Take 6 tabs by mouth daily  for 2 days, then 5 tabs for 2 days, then 4 tabs for 2 days, then 3 tabs for 2 days, 2 tabs for 2 days, then 1 tab by mouth daily for 2 days 42 tablet 0  . Ranitidine HCl (ZANTAC PO) Take 1 tablet by mouth daily as needed. For heartburn.    . simvastatin (ZOCOR) 10 MG tablet Take 5 mg by mouth at bedtime.       Melene Muller ON 10/20/2015] aspirin EC  81 mg Oral Daily  . atorvastatin  80 mg Oral q1800  . enoxaparin (LOVENOX) injection  40 mg Subcutaneous Q24H  . mupirocin ointment  1 application Topical BID  . predniSONE  10 mg Oral Q breakfast    Infusions:    No Known Allergies  Social History   Social History  . Marital Status: Single    Spouse Name: N/A  . Number of Children: N/A  . Years of Education: N/A   Occupational  History  . Not on file.   Social History Main Topics  . Smoking status: Never Smoker   . Smokeless tobacco: Never Used  . Alcohol Use: No  . Drug Use: No  . Sexual Activity: Not on file   Other Topics Concern  . Not on file   Social History Narrative    Family History  Problem Relation Age of Onset  . Heart disease Father   . Alzheimer's disease Father   . Cancer Sister   . Hodgkin's lymphoma Sister   . Stomach cancer Maternal Grandmother   . Osteoporosis Brother     PHYSICAL EXAM: Filed Vitals:   10/19/15 1600 10/19/15 1629  BP: 108/60 117/57  Pulse: 82 79  Temp:  98.6 F (37 C)  Resp: 20 17     Intake/Output Summary (Last 24 hours) at 10/19/15 2208 Last data filed at 10/19/15 1800  Gross per 24 hour  Intake    222 ml  Output      0 ml  Net    222 ml    General:  Well appearing. No respiratory difficulty HEENT: normal Neck: supple. no JVD. Carotids 2+ bilat; no bruits. No lymphadenopathy or thryomegaly appreciated. Cor: PMI nondisplaced. Regular rate & rhythm. No rubs, gallops or murmurs. Lungs: clear Abdomen: soft, nontender, nondistended. No hepatosplenomegaly. No bruits or masses. Good bowel sounds. Extremities: no cyanosis, clubbing, rash, edema Neuro: alert & oriented x 3, cranial nerves grossly intact. moves all 4 extremities w/o difficulty. Affect pleasant.  ECG:  Results for orders placed or performed during the hospital encounter of 10/19/15 (from the past 24 hour(s))  Basic metabolic panel     Status: None   Collection Time: 10/19/15 11:35 AM  Result Value Ref Range   Sodium 143 135 - 145 mmol/L  Potassium 4.7 3.5 - 5.1 mmol/L   Chloride 108 101 - 111 mmol/L   CO2 26 22 - 32 mmol/L   Glucose, Bld 88 65 - 99 mg/dL   BUN 17 6 - 20 mg/dL   Creatinine, Ser 8.75 0.44 - 1.00 mg/dL   Calcium 8.9 8.9 - 64.3 mg/dL   GFR calc non Af Amer >60 >60 mL/min   GFR calc Af Amer >60 >60 mL/min   Anion gap 9 5 - 15  CBC     Status: Abnormal    Collection Time: 10/19/15 11:35 AM  Result Value Ref Range   WBC 14.0 (H) 4.0 - 10.5 K/uL   RBC 4.33 3.87 - 5.11 MIL/uL   Hemoglobin 11.4 (L) 12.0 - 15.0 g/dL   HCT 32.9 (L) 51.8 - 84.1 %   MCV 81.3 78.0 - 100.0 fL   MCH 26.3 26.0 - 34.0 pg   MCHC 32.4 30.0 - 36.0 g/dL   RDW 66.0 63.0 - 16.0 %   Platelets 238 150 - 400 K/uL  I-stat troponin, ED (not at [facility], [facility])     Status: None   Collection Time: 10/19/15 11:39 AM  Result Value Ref Range   Troponin i, poc 0.00 0.00 - 0.08 ng/mL   Comment 3          I-stat troponin, ED     Status: None   Collection Time: 10/19/15  3:00 PM  Result Value Ref Range   Troponin i, poc 0.00 0.00 - 0.08 ng/mL   Comment 3          Troponin I-serum (0, 3, 6 hours)     Status: None   Collection Time: 10/19/15  4:43 PM  Result Value Ref Range   Troponin I <0.03 <0.031 ng/mL  Troponin I-serum (0, 3, 6 hours)     Status: None   Collection Time: 10/19/15  7:23 PM  Result Value Ref Range   Troponin I <0.03 <0.031 ng/mL   Dg Chest 2 View  10/19/2015  CLINICAL DATA:  60 year old female with acute chest pain today. EXAM: CHEST  2 VIEW COMPARISON:  None. FINDINGS: The cardiomediastinal silhouette is unremarkable. There is no evidence of focal airspace disease, pulmonary edema, suspicious pulmonary nodule/mass, pleural effusion, or pneumothorax. No acute bony abnormalities are identified. IMPRESSION: No active cardiopulmonary disease. Electronically Signed   By: Harmon Pier M.D.   On: 10/19/2015 12:23     EKG: SR. Non specific ST changes, ST in anterior leads  ASSESSMENT: Chest pain. Some atypical and typical features with intermediate probability. R/o ACS/CAD Hyperlipidemia     PLAN/DISCUSSION:  1. Its reasonable to proceed with stress testing while in hospital. She has recent left sciatica flare up and leg pain and cannot walk on treadmill so lexiscan or dobutamine echo   Thankyou for the consult   Sulaiman Alonna Minium

## 2015-10-20 ENCOUNTER — Observation Stay (HOSPITAL_COMMUNITY): Payer: BC Managed Care – PPO

## 2015-10-20 ENCOUNTER — Encounter (HOSPITAL_COMMUNITY): Payer: Self-pay | Admitting: Cardiology

## 2015-10-20 DIAGNOSIS — T300 Burn of unspecified body region, unspecified degree: Secondary | ICD-10-CM | POA: Diagnosis not present

## 2015-10-20 DIAGNOSIS — K219 Gastro-esophageal reflux disease without esophagitis: Secondary | ICD-10-CM | POA: Diagnosis not present

## 2015-10-20 DIAGNOSIS — I10 Essential (primary) hypertension: Secondary | ICD-10-CM

## 2015-10-20 DIAGNOSIS — R072 Precordial pain: Secondary | ICD-10-CM | POA: Diagnosis not present

## 2015-10-20 DIAGNOSIS — D72829 Elevated white blood cell count, unspecified: Secondary | ICD-10-CM

## 2015-10-20 DIAGNOSIS — R079 Chest pain, unspecified: Secondary | ICD-10-CM | POA: Diagnosis not present

## 2015-10-20 DIAGNOSIS — E785 Hyperlipidemia, unspecified: Secondary | ICD-10-CM | POA: Diagnosis not present

## 2015-10-20 LAB — BASIC METABOLIC PANEL
Anion gap: 3 — ABNORMAL LOW (ref 5–15)
BUN: 11 mg/dL (ref 6–20)
CHLORIDE: 108 mmol/L (ref 101–111)
CO2: 29 mmol/L (ref 22–32)
Calcium: 8.4 mg/dL — ABNORMAL LOW (ref 8.9–10.3)
Creatinine, Ser: 0.84 mg/dL (ref 0.44–1.00)
GFR calc Af Amer: >60 mL/min (ref >60)
GFR calc non Af Amer: >60 mL/min (ref >60)
GLUCOSE: 107 mg/dL — AB (ref 65–99)
POTASSIUM: 3.9 mmol/L (ref 3.5–5.1)
Sodium: 140 mmol/L (ref 135–145)

## 2015-10-20 LAB — TROPONIN I: Troponin I: <0.03 ng/mL (ref <0.031)

## 2015-10-20 LAB — CBC
HCT: 35.2 % — ABNORMAL LOW (ref 36.0–46.0)
HEMOGLOBIN: 11.2 g/dL — AB (ref 12.0–15.0)
MCH: 25.9 pg — AB (ref 26.0–34.0)
MCHC: 31.8 g/dL (ref 30.0–36.0)
MCV: 81.5 fL (ref 78.0–100.0)
Platelets: 259 10*3/uL (ref 150–400)
RBC: 4.32 MIL/uL (ref 3.87–5.11)
RDW: 14.3 % (ref 11.5–15.5)
WBC: 10.2 10*3/uL (ref 4.0–10.5)

## 2015-10-20 LAB — NM MYOCAR MULTI W/SPECT W/WALL MOTION / EF
CHL CUP RESTING HR STRESS: 56 {beats}/min
CSEPED: 5 min
CSEPEW: 1 METS
CSEPHR: 64 %
CSEPPHR: 103 {beats}/min
MPHR: 159 {beats}/min

## 2015-10-20 LAB — LIPID PANEL
CHOL/HDL RATIO: 2.8 ratio
Cholesterol: 144 mg/dL (ref 0–200)
HDL: 52 mg/dL (ref >40)
LDL Cholesterol: 76 mg/dL (ref 0–99)
Triglycerides: 79 mg/dL (ref <150)
VLDL: 16 mg/dL (ref 0–40)

## 2015-10-20 MED ORDER — REGADENOSON 0.4 MG/5ML IV SOLN
INTRAVENOUS | Status: AC
  Filled 2015-10-20: qty 5

## 2015-10-20 MED ORDER — ASPIRIN 81 MG PO TBEC: 81.0000 mg | DELAYED_RELEASE_TABLET | Freq: Every day | ORAL | Status: AC

## 2015-10-20 MED ORDER — LOSARTAN POTASSIUM 25 MG PO TABS: 25.0000 mg | ORAL_TABLET | Freq: Every day | ORAL | Status: DC

## 2015-10-20 MED ORDER — REGADENOSON 0.4 MG/5ML IV SOLN
0.4000 mg | Freq: Once | INTRAVENOUS | Status: AC
  Administered 2015-10-20: 0.4 mg via INTRAVENOUS
  Filled 2015-10-20: qty 5

## 2015-10-20 MED ORDER — REGADENOSON 0.4 MG/5ML IV SOLN: 0.4000 mg | Freq: Once | INTRAVENOUS | Status: DC

## 2015-10-20 MED ORDER — TECHNETIUM TC 99M MEBROFENIN IV KIT
5.0000 | PACK | Freq: Once | INTRAVENOUS | Status: AC | PRN
  Administered 2015-10-20: 5 via INTRAVENOUS
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
## 2015-10-20 NOTE — Assessment & Plan Note (Signed)
New to provider, hx of similar for pt.  Agree w/ muscle relaxers and pain meds prn but will start prednisone taper rather than NSAIDs.  No imaging at this time as there are no red flags on hx or PE.  Refer to PT for lumbar strengthening.  If no improvement, will proceed w/ imaging and/or referral.  Reviewed supportive care and red flags that should prompt return.  Pt expressed understanding and is in agreement w/ plan.

## 2015-10-20 NOTE — Discharge Instructions (Signed)
apply small piece of xeroform to each area on the back, cover with dry dressing. Change daily.   Heart-Healthy Eating Plan Many factors influence your heart health, including eating and exercise habits. Heart (coronary) risk increases with abnormal blood fat (lipid) levels. Heart-healthy meal planning includes limiting unhealthy fats, increasing healthy fats, and making other small dietary changes. This includes maintaining a healthy body weight to help keep lipid levels within a normal range. WHAT IS MY PLAN?  Your health care provider recommends that you:  Get no more than _________% of the total calories in your daily diet from fat.  Limit your intake of saturated fat to less than _________% of your total calories each day.  Limit the amount of cholesterol in your diet to less than _________ mg per day. WHAT TYPES OF FAT SHOULD I CHOOSE?  Choose healthy fats more often. Choose monounsaturated and polyunsaturated fats, such as olive oil and canola oil, flaxseeds, walnuts, almonds, and seeds.  Eat more omega-3 fats. Good choices include salmon, mackerel, sardines, tuna, flaxseed oil, and ground flaxseeds. Aim to eat fish at least two times each week.  Limit saturated fats. Saturated fats are primarily found in animal products, such as meats, butter, and cream. Plant sources of saturated fats include palm oil, palm kernel oil, and coconut oil.  Avoid foods with partially hydrogenated oils in them. These contain trans fats. Examples of foods that contain trans fats are stick margarine, some tub margarines, cookies, crackers, and other baked goods. WHAT GENERAL GUIDELINES DO I NEED TO FOLLOW?  Check food labels carefully to identify foods with trans fats or high amounts of saturated fat.  Fill one half of your plate with vegetables and green salads. Eat 4-5 servings of vegetables per day. A serving of vegetables equals 1 cup of raw leafy vegetables,  cup of raw or cooked cut-up  vegetables, or  cup of vegetable juice.  Fill one fourth of your plate with whole grains. Look for the word "whole" as the first word in the ingredient list.  Fill one fourth of your plate with lean protein foods.  Eat 4-5 servings of fruit per day. A serving of fruit equals one medium whole fruit,  cup of dried fruit,  cup of fresh, frozen, or canned fruit, or  cup of 100% fruit juice.  Eat more foods that contain soluble fiber. Examples of foods that contain this type of fiber are apples, broccoli, carrots, beans, peas, and barley. Aim to get 20-30 g of fiber per day.  Eat more home-cooked food and less restaurant, buffet, and fast food.  Limit or avoid alcohol.  Limit foods that are high in starch and sugar.  Avoid fried foods.  Cook foods by using methods other than frying. Baking, boiling, grilling, and broiling are all great options. Other fat-reducing suggestions include:  Removing the skin from poultry.  Removing all visible fats from meats.  Skimming the fat off of stews, soups, and gravies before serving them.  Steaming vegetables in water or broth.  Lose weight if you are overweight. Losing just 5-10% of your initial body weight can help your overall health and prevent diseases such as diabetes and heart disease.  Increase your consumption of nuts, legumes, and seeds to 4-5 servings per week. One serving of dried beans or legumes equals  cup after being cooked, one serving of nuts equals 1 ounces, and one serving of seeds equals  ounce or 1 tablespoon.  You may need to monitor your  salt (sodium) intake, especially if you have high blood pressure. Talk with your health care provider or dietitian to get more information about reducing sodium. WHAT FOODS CAN I EAT? Grains Breads, including [place], white, pita, wheat, raisin, rye, oatmeal, and [place]. Tortillas that are neither fried nor made with lard or trans fat. Low-fat rolls, including hotdog and hamburger buns  and English muffins. Biscuits. Muffins. Waffles. Pancakes. Light popcorn. Whole-grain cereals. Flatbread. Melba toast. Pretzels. Breadsticks. Rusks. Low-fat snacks and crackers, including oyster, saltine, matzo, graham, animal, and rye. Rice and pasta, including brown rice and those that are made with whole wheat. Vegetables All vegetables. Fruits All fruits, but limit coconut. Meats and Other Protein Sources Lean, well-trimmed beef, veal, pork, and lamb. Chicken and [place] without skin. All fish and shellfish. Wild duck, rabbit, pheasant, and venison. Egg whites or low-cholesterol egg substitutes. Dried beans, peas, lentils, and tofu.Seeds and most nuts. Dairy Low-fat or nonfat cheeses, including ricotta, string, and mozzarella. Skim or 1% milk that is liquid, powdered, or evaporated. Buttermilk that is made with low-fat milk. Nonfat or low-fat yogurt. Beverages Mineral water. Diet carbonated beverages. Sweets and Desserts Sherbets and fruit ices. Honey, jam, marmalade, jelly, and syrups. Meringues and gelatins. Pure sugar candy, such as hard candy, jelly beans, gumdrops, mints, marshmallows, and small amounts of dark chocolate. [employer]. Eat all sweets and desserts in moderation. Fats and Oils Nonhydrogenated (trans-free) margarines. Vegetable oils, including soybean, sesame, sunflower, olive, peanut, safflower, corn, canola, and cottonseed. Salad dressings or mayonnaise that are made with a vegetable oil. Limit added fats and oils that you use for cooking, baking, salads, and as spreads. Other Cocoa powder. Coffee and tea. All seasonings and condiments. The items listed above may not be a complete list of recommended foods or beverages. Contact your dietitian for more options. WHAT FOODS ARE NOT RECOMMENDED? Grains Breads that are made with saturated or trans fats, oils, or whole milk. Croissants. Butter rolls. Cheese breads. Sweet rolls. Donuts. Buttered popcorn. Chow mein noodles.  High-fat crackers, such as cheese or butter crackers. Meats and Other Protein Sources Fatty meats, such as hotdogs, short ribs, sausage, spareribs, bacon, ribeye roast or steak, and mutton. High-fat deli meats, such as salami and bologna. Caviar. Domestic duck and goose. Organ meats, such as kidney, liver, sweetbreads, brains, gizzard, chitterlings, and heart. Dairy Cream, sour cream, cream cheese, and creamed cottage cheese. Whole milk cheeses, including blue (bleu), [address], [place], [place], [address], [place], [address], [place], [place], and [place]. Whole or 2% milk that is liquid, evaporated, or condensed. Whole buttermilk. Cream sauce or high-fat cheese sauce. Yogurt that is made from whole milk. Beverages Regular sodas and drinks with added sugar. Sweets and Desserts Frosting. Pudding. Cookies. Cakes other than angel food cake. Candy that has milk chocolate or white chocolate, hydrogenated fat, butter, coconut, or unknown ingredients. Buttered syrups. Full-fat ice cream or ice cream drinks. Fats and Oils Gravy that has suet, meat fat, or shortening. Cocoa butter, hydrogenated oils, palm oil, coconut oil, palm kernel oil. These can often be found in baked products, candy, fried foods, nondairy creamers, and whipped toppings. Solid fats and shortenings, including bacon fat, salt pork, lard, and butter. Nondairy cream substitutes, such as coffee creamers and sour cream substitutes. Salad dressings that are made of unknown oils, cheese, or sour cream. The items listed above may not be a complete list of foods and beverages to avoid. Contact your dietitian for more information.   This information is not intended to replace  advice given to you by your health care provider. Make sure you discuss any questions you have with your health care provider.   Document Released: 06/16/2008 Document Revised: 09/28/2014 Document Reviewed: 03/01/2014 Elsevier Interactive Patient Education [employer].
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
## 2015-10-21 ENCOUNTER — Telehealth: Payer: Self-pay

## 2015-10-21 LAB — HEMOGLOBIN A1C
Hgb A1c MFr Bld: 6.5 % — ABNORMAL HIGH (ref 4.8–5.6)
Mean Plasma Glucose: 140 mg/dL

## 2015-10-21 NOTE — Telephone Encounter (Signed)
Received fax   Date: 10/19/15   Time: 10:11 am  Nurse:  Joycelyn Schmid, Rn Chief complaint:  Chest pain (>= 21 years)--pain, pressure, heaviness or tightness. Initial comment:  Caller states was seen in office yesterday, having pressure in her chest, also feeling it in her jaw bone  Reason for call:  Caller states she is having pressure in her chest and it is radiating into her right jaw.  States the pressure and "heaviness" began on Friday evening, the radiating discomfort began this morning.    Disposition:  911 Outcome Documentation   Reason:  By end of triage 911 was at the caller's home, they had been called by her.  Per pt's chart, pt was taken to the hospital on 10/19/15 and was admitted.
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
## 2015-10-25 ENCOUNTER — Ambulatory Visit (INDEPENDENT_AMBULATORY_CARE_PROVIDER_SITE_OTHER): Payer: BC Managed Care – PPO | Admitting: Family Medicine

## 2015-10-25 ENCOUNTER — Encounter: Payer: Self-pay | Admitting: Family Medicine

## 2015-10-25 VITALS — BP 132/80 | HR 69 | Temp 97.0°F | Ht 60.5 in | Wt 133.8 lb

## 2015-10-25 DIAGNOSIS — I10 Essential (primary) hypertension: Secondary | ICD-10-CM

## 2015-10-25 DIAGNOSIS — K30 Functional dyspepsia: Secondary | ICD-10-CM | POA: Diagnosis not present

## 2015-10-25 DIAGNOSIS — K219 Gastro-esophageal reflux disease without esophagitis: Secondary | ICD-10-CM

## 2015-10-25 DIAGNOSIS — R109 Unspecified abdominal pain: Secondary | ICD-10-CM

## 2015-10-25 DIAGNOSIS — R072 Precordial pain: Secondary | ICD-10-CM

## 2015-10-25 MED ORDER — OMEPRAZOLE 40 MG PO CPDR: 40.0000 mg | DELAYED_RELEASE_CAPSULE | Freq: Every day | ORAL | Status: DC

## 2015-10-25 MED ORDER — GI COCKTAIL ~~LOC~~: 30.0000 mL | Freq: Once | ORAL | Status: DC

## 2015-10-25 NOTE — Patient Instructions (Addendum)
Follow up in 3-4 weeks to recheck BP and reflux STOP the Naproxen and prednisone START the Omeprazole once daily (hold the Ranitidine until you come back to see me) Continue the cyclobenzaprine as needed for spasm or pain Use the Oxycodone as needed for severe pain Continue the Losartan for now, but the plan will be to stop this if BP looks good at next visit Call with any questions or concerns Hang in there!!!
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
## 2015-10-25 NOTE — Progress Notes (Signed)
Pre visit review using our clinic review tool, if applicable. No additional management support is needed unless otherwise documented below in the visit note. 

## 2015-10-27 NOTE — Assessment & Plan Note (Signed)
Deteriorated.  Pt's GERD is much worse since starting Prednisone.  I suspect pt was taking both prednisone and NSAIDs for lumbar radiculopathy despite being advised to d/c NSAIDs.  Pt's sxs improved in office w/ GI cocktail.  Will start PPI.  Reviewed dietary and lifestyle modifications.  Will follow.

## 2015-10-27 NOTE — Assessment & Plan Note (Signed)
New.  Pt's elevated BP was likely due to anxiety and pain.  She will likely be able to D/C low dose ARB if BP is at goal at next appt.  Pt expressed understanding and is in agreement w/ plan.

## 2015-10-27 NOTE — Assessment & Plan Note (Addendum)
New to provider.  Pt's recent hospitalization resulted in clean cardiac w/u.  I suspect her CP was actually severe GERD.  Start PPI.  Reviewed supportive care and red flags that should prompt return.  Pt expressed understanding and is in agreement w/ plan.

## 2015-10-30 ENCOUNTER — Encounter: Payer: Self-pay | Admitting: Rehabilitative and Restorative Service Providers"

## 2015-10-30 ENCOUNTER — Ambulatory Visit (INDEPENDENT_AMBULATORY_CARE_PROVIDER_SITE_OTHER): Payer: BC Managed Care – PPO | Admitting: Rehabilitative and Restorative Service Providers"

## 2015-10-30 DIAGNOSIS — Z7409 Other reduced mobility: Secondary | ICD-10-CM | POA: Diagnosis not present

## 2015-10-30 DIAGNOSIS — R6889 Other general symptoms and signs: Secondary | ICD-10-CM

## 2015-10-30 DIAGNOSIS — M5416 Radiculopathy, lumbar region: Secondary | ICD-10-CM | POA: Diagnosis not present

## 2015-10-30 DIAGNOSIS — M2560 Stiffness of unspecified joint, not elsewhere classified: Secondary | ICD-10-CM

## 2015-10-30 DIAGNOSIS — M623 Immobility syndrome (paraplegic): Secondary | ICD-10-CM | POA: Diagnosis not present

## 2015-10-30 DIAGNOSIS — R531 Weakness: Secondary | ICD-10-CM

## 2015-10-30 NOTE — Patient Instructions (Signed)
Self massage ~4 inch rubber ball.   Abdominal Bracing With Pelvic Floor (Hook-Lying)    With neutral spine, tighten pelvic floor and abdominals sucking belly button to back bone; tighten muscles in back at waist. Hold 10 sec Repeat __10_ times. Do ___ times a day. Progress do this exercise in sitting; standing; walking; functional activities.    Trunk: Prone Extension (Press-Ups)    Lie on stomach on firm, flat surface. Relax bottom and legs. Raise chest in air with elbows straight. Keep hips flat on surface, sag stomach. Hold _2-3___ seconds. Repeat __10__ times. Do _2-3___ sessions per day. CAUTION: Movement should be gentle and slow.   Trunk Extension    Standing, place back of open hands on low back. Straighten spine then arch the back and move shoulders back. Hold 2-3 Repeat __2-3__ times per session. Do __several__ sessions per   Sleeping on Back  Place pillow under knees. A pillow with cervical support and a roll around waist are also helpful. Copyright  VHI. All rights reserved.  Sleeping on Side Place pillow between knees. Use cervical support under neck and a roll around waist as needed. Copyright  VHI. All rights reserved.   Sleeping on Stomach   If this is the only desirable sleeping position, place pillow under lower legs, and under stomach or chest as needed.  Posture - Sitting   Sit upright, head facing forward. Try using a roll to support lower back. Keep shoulders relaxed, and avoid rounded back. Keep hips level with knees. Avoid crossing legs for long periods. Stand to Sit / Sit to Stand   To sit: Bend knees to lower self onto front edge of chair, then scoot back on seat. To stand: Reverse sequence by placing one foot forward, and scoot to front of seat. Use rocking motion to stand up.   Work Height and Reach  Ideal work height is no more than 2 to 4 inches below elbow level when standing, and at elbow level when sitting. Reaching should be  limited to arm's length, with elbows slightly bent.  Bending  Bend at hips and knees, not back. Keep feet shoulder-width apart.    Posture - Standing   Good posture is important. Avoid slouching and forward head thrust. Maintain curve in low back and align ears over shoul- ders, hips over ankles.  Alternating Positions   Alternate tasks and change positions frequently to reduce fatigue and muscle tension. Take rest breaks. Computer Work   Position work to Art gallery manager. Use proper work and seat height. Keep shoulders back and down, wrists straight, and elbows at right angles. Use chair that provides full back support. Add footrest and lumbar roll as needed.  Getting Into / Out of Car  Lower self onto seat, scoot back, then bring in one leg at a time. Reverse sequence to get out.  Dressing  Lie on back to pull socks or slacks over feet, or sit and bend leg while keeping back straight.    Housework - Sink  Place one foot on ledge of cabinet under sink when standing at sink for prolonged periods.   Pushing / Pulling  Pushing is preferable to pulling. Keep back in proper alignment, and use leg muscles to do the work.  Deep Squat   Squat and lift with both arms held against upper trunk. Tighten stomach muscles without holding breath. Use smooth movements to avoid jerking.  Avoid Twisting   Avoid twisting or bending back. Pivot around using foot movements,  and bend at knees if needed when reaching for articles.  Carrying Luggage   Distribute weight evenly on both sides. Use a cart whenever possible. Do not twist trunk. Move body as a unit.   Lifting Principles .Maintain proper posture and head alignment. .Slide object as close as possible before lifting. .Move obstacles out of the way. .Test before lifting; ask for help if too heavy. .Tighten stomach muscles without holding breath. .Use smooth movements; do not jerk. .Use legs to do the work, and pivot with  feet. .Distribute the work load symmetrically and close to the center of trunk. .Push instead of pull whenever possible.   Ask For Help   Ask for help and delegate to others when possible. Coordinate your movements when lifting together, and maintain the low back curve.  Log Roll   Lying on back, bend left knee and place left arm across chest. Roll all in one movement to the right. Reverse to roll to the left. Always move as one unit. Housework - Sweeping  Use long-handled equipment to avoid stooping.   Housework - Wiping  Position yourself as close as possible to reach work surface. Avoid straining your back.  Laundry - Unloading Wash   To unload small items at bottom of washer, lift leg opposite to arm being used to reach.  Gardening - Raking  Move close to area to be raked. Use arm movements to do the work. Keep back straight and avoid twisting.     Cart  When reaching into cart with one arm, lift opposite leg to keep back straight.   Getting Into / Out of Bed  Lower self to lie down on one side by raising legs and lowering head at the same time. Use arms to assist moving without twisting. Bend both knees to roll onto back if desired. To sit up, start from lying on side, and use same move-ments in reverse. Housework - Vacuuming  Hold the vacuum with arm held at side. Step back and forth to move it, keeping head up. Avoid twisting.   Laundry - Armed forces training and education officer so that bending and twisting can be avoided.   Laundry - Unloading Dryer  Squat down to reach into clothes dryer or use a reacher.  Gardening - Weeding / Psychiatric nurse or Kneel. Knee pads may be helpful.
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
## 2015-11-06 ENCOUNTER — Ambulatory Visit (INDEPENDENT_AMBULATORY_CARE_PROVIDER_SITE_OTHER): Payer: BC Managed Care – PPO | Admitting: Physical Therapy

## 2015-11-06 DIAGNOSIS — M2560 Stiffness of unspecified joint, not elsewhere classified: Secondary | ICD-10-CM

## 2015-11-06 DIAGNOSIS — M5416 Radiculopathy, lumbar region: Secondary | ICD-10-CM

## 2015-11-06 DIAGNOSIS — R6889 Other general symptoms and signs: Secondary | ICD-10-CM

## 2015-11-06 DIAGNOSIS — Z7409 Other reduced mobility: Secondary | ICD-10-CM

## 2015-11-06 DIAGNOSIS — R531 Weakness: Secondary | ICD-10-CM

## 2015-11-06 DIAGNOSIS — M623 Immobility syndrome (paraplegic): Secondary | ICD-10-CM | POA: Diagnosis not present

## 2015-11-06 NOTE — Patient Instructions (Signed)
  Abdominal Bracing With Pelvic Floor (Hook-Lying)   With neutral spine, tighten pelvic floor and abdominals. Hold 10 seconds. Repeat __10_ times. Do _1__ times a day.  Continue tightening abdominals for the following exercises:   Knee to Chest: Transverse Plane Stability   Bring one knee up, then return. Be sure pelvis does not roll side to side. Keep pelvis still. Lift knee __10_ times each leg. Restabilize pelvis. Repeat with other leg. Do _1-2__ sets, _1__ times per day.   Hip External Rotation With Pillow: Transverse Plane Stability   One knee bent, one leg straight, on pillow. Slowly roll bent knee out. Be sure pelvis does not rotate. Do _10__ times. Restabilize pelvis. Repeat with other leg. Do _1-2__ sets, _1__ times per day.  Heel Slide: 4-10 Inches - Transverse Plane Stability   Slide heel 4 inches down. Be sure pelvis does not rotate. Do _10__ times. Restabilize pelvis. Repeat with other leg. Do __1_ sets, _1__ times per day.   [facility] [address] [place], [place] [address]  [phone] (office) [phone] (fax)
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
## 2015-11-14 ENCOUNTER — Encounter: Payer: Self-pay | Admitting: Rehabilitative and Restorative Service Providers"

## 2015-11-14 ENCOUNTER — Ambulatory Visit (INDEPENDENT_AMBULATORY_CARE_PROVIDER_SITE_OTHER): Payer: BC Managed Care – PPO | Admitting: Rehabilitative and Restorative Service Providers"

## 2015-11-14 DIAGNOSIS — M623 Immobility syndrome (paraplegic): Secondary | ICD-10-CM | POA: Diagnosis not present

## 2015-11-14 DIAGNOSIS — Z7409 Other reduced mobility: Secondary | ICD-10-CM | POA: Diagnosis not present

## 2015-11-14 DIAGNOSIS — R6889 Other general symptoms and signs: Secondary | ICD-10-CM

## 2015-11-14 DIAGNOSIS — M5416 Radiculopathy, lumbar region: Secondary | ICD-10-CM

## 2015-11-14 DIAGNOSIS — R531 Weakness: Secondary | ICD-10-CM

## 2015-11-14 DIAGNOSIS — M2560 Stiffness of unspecified joint, not elsewhere classified: Secondary | ICD-10-CM
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
## 2015-11-14 NOTE — Patient Instructions (Addendum)
Piriformis Stretch    Lying on back, pull right knee toward opposite shoulder. Hold __30__ seconds. Repeat __3__ times. Do ___2_ sessions per day.    Combination (Hook-Lying)    Tighten core and slowly raise left leg and lower right arm over head. Keep trunk rigid(core tight). Repeat __10__ times per set. Do _1-2__ sets per session. Do __1-2__ sessions per day.     Sitting on firm surface with pillows along right side lean body over the pillows bringing Left arm over your head until you feel a stretch through your left side of low back  Hold 30 sec repeat 2 times 1 time per day

## 2015-11-19 ENCOUNTER — Ambulatory Visit: Payer: BC Managed Care – PPO | Admitting: Family Medicine

## 2015-11-22 ENCOUNTER — Encounter: Payer: BC Managed Care – PPO | Admitting: Rehabilitative and Restorative Service Providers"

## 2015-11-22 ENCOUNTER — Other Ambulatory Visit: Payer: Self-pay | Admitting: General Practice

## 2015-11-22 MED ORDER — LOSARTAN POTASSIUM 25 MG PO TABS: 25.0000 mg | ORAL_TABLET | Freq: Every day | ORAL | Status: DC

## 2015-11-27 ENCOUNTER — Encounter: Payer: BC Managed Care – PPO | Admitting: Rehabilitative and Restorative Service Providers"

## 2015-11-29 ENCOUNTER — Ambulatory Visit (INDEPENDENT_AMBULATORY_CARE_PROVIDER_SITE_OTHER): Payer: BC Managed Care – PPO | Admitting: Family Medicine

## 2015-11-29 ENCOUNTER — Encounter: Payer: Self-pay | Admitting: Family Medicine

## 2015-11-29 ENCOUNTER — Ambulatory Visit: Payer: BC Managed Care – PPO | Admitting: Family Medicine

## 2015-11-29 VITALS — BP 126/82 | HR 71 | Temp 98.1°F | Resp 16 | Ht 61.0 in | Wt 132.2 lb

## 2015-11-29 DIAGNOSIS — K219 Gastro-esophageal reflux disease without esophagitis: Secondary | ICD-10-CM | POA: Diagnosis not present

## 2015-11-29 DIAGNOSIS — I10 Essential (primary) hypertension: Secondary | ICD-10-CM

## 2015-11-29 MED ORDER — RANITIDINE HCL 300 MG PO TABS: 300.0000 mg | ORAL_TABLET | Freq: Every day | ORAL | Status: DC

## 2015-11-29 NOTE — Assessment & Plan Note (Signed)
BP is much improved since her pain has improved.  Pt is asking for trial off medication and recheck BP in a few months.  Applauded her efforts at healthy diet and regular exercise.  Will follow.

## 2015-11-29 NOTE — Progress Notes (Signed)
   Subjective:    Patient ID: Connie Gross, female    DOB: Oct 15, 1955, 60 y.o.   MRN: [id number]  HPI HTN- new dx for pt after recent hospitalization.  Currently on Losartan 25mg  daily.  No CP, SOB, HAs, visual changes, edema.  Pt is interested in stopping BP meds.  GERD- pt reports sxs are 'much, much better' since starting Omeprazole and stopping Naproxen.  Pt was previously on Ranitidine.   Review of Systems For ROS see HPI     Objective:   Physical Exam  Constitutional: She is oriented to person, place, and time. She appears well-developed and well-nourished. No distress.  HENT:  Head: Normocephalic and atraumatic.  Eyes: Conjunctivae and EOM are normal. Pupils are equal, round, and reactive to light.  Neck: Normal range of motion. Neck supple. No thyromegaly present.  Cardiovascular: Normal rate, regular rhythm, normal heart sounds and intact distal pulses.   No murmur heard. Pulmonary/Chest: Effort normal and breath sounds normal. No respiratory distress.  Abdominal: Soft. She exhibits no distension. There is no tenderness.  Musculoskeletal: She exhibits no edema.  Lymphadenopathy:    She has no cervical adenopathy.  Neurological: She is alert and oriented to person, place, and time.  Skin: Skin is warm and dry.  Psychiatric: She has a normal mood and affect. Her behavior is normal.  Vitals reviewed.         Assessment & Plan:

## 2015-11-29 NOTE — Progress Notes (Signed)
Pre visit review using our clinic review tool, if applicable. No additional management support is needed unless otherwise documented below in the visit note. 

## 2015-11-29 NOTE — Assessment & Plan Note (Signed)
Chronic problem.  Improved since stopping NSAIDs and starting Omeprazole.  Will resume previous H2 blocker nightly- prescription sent.  Reviewed lifestyle and dietary modifications.  Will follow.

## 2015-11-29 NOTE — Patient Instructions (Signed)
Follow up in May to recheck BP and sugar STOP the Omeprazole and the Losartan START the Ranitidine nightly Keep up the good work on healthy diet and regular exercise- you look great! Call with any questions or concerns If you want to join us at the new [place] office, any scheduled appointments will automatically transfer and we will see you at [address] [place], [place], [place] [id number] (OPENING 3/23) Have a great weekend!!!

## 2016-01-10 ENCOUNTER — Telehealth: Payer: Self-pay | Admitting: Family Medicine

## 2016-01-10 NOTE — Telephone Encounter (Signed)
Called pt and informed of PCP advice. Pt stated an understanding and will call on Monday if no better.

## 2016-01-10 NOTE — Telephone Encounter (Signed)
Pt states that she was pulling weeds out of her yard on Tuesday and is now experiencing some mid to lower back pain. Pt is asking if there is anything that Dr Beverely Lowabori could call in that would be strong than OTC meds.,[facility] on [address]

## 2016-01-10 NOTE — Telephone Encounter (Signed)
She can take 2 OTC Aleve twice daily- this is the equivalent of prescription strength.  But she definitely needs to take this w/ food!  If no improvement over the weekend, will need to schedule appt

## 2016-01-27 ENCOUNTER — Ambulatory Visit (INDEPENDENT_AMBULATORY_CARE_PROVIDER_SITE_OTHER): Payer: BC Managed Care – PPO | Admitting: Family Medicine

## 2016-01-27 ENCOUNTER — Encounter: Payer: Self-pay | Admitting: Family Medicine

## 2016-01-27 ENCOUNTER — Other Ambulatory Visit: Payer: Self-pay | Admitting: General Practice

## 2016-01-27 VITALS — BP 124/80 | HR 76 | Temp 98.2°F | Resp 16 | Ht 61.0 in | Wt 133.1 lb

## 2016-01-27 DIAGNOSIS — M5416 Radiculopathy, lumbar region: Secondary | ICD-10-CM | POA: Diagnosis not present

## 2016-01-27 DIAGNOSIS — R7302 Impaired glucose tolerance (oral): Secondary | ICD-10-CM | POA: Diagnosis not present

## 2016-01-27 DIAGNOSIS — I10 Essential (primary) hypertension: Secondary | ICD-10-CM | POA: Diagnosis not present

## 2016-01-27 HISTORY — DX: Impaired glucose tolerance (oral): R73.02

## 2016-01-27 LAB — HEMOGLOBIN A1C
HEMOGLOBIN A1C: 6.5 % — AB (ref <5.7)
MEAN PLASMA GLUCOSE: 140 mg/dL

## 2016-01-27 MED ORDER — OMEPRAZOLE 40 MG PO CPDR: 40.0000 mg | DELAYED_RELEASE_CAPSULE | Freq: Every day | ORAL | Status: DC

## 2016-01-27 MED ORDER — LOSARTAN POTASSIUM 25 MG PO TABS: ORAL_TABLET | ORAL | Status: DC

## 2016-01-27 NOTE — Patient Instructions (Signed)
We'll notify you of your lab results and make any changes if needed (we will also determine when you need to f/u) We'll call you with your PT appt Keep up the good work on healthy diet and regular exercise- you look great! Call with any questions or concerns Hang in there!!!

## 2016-01-27 NOTE — Assessment & Plan Note (Signed)
New.  Pt's last labs show A1C of 6.5.  She has been working on [employer] diet and regular exercise.  She is due for repeat A1C today to determine if additional tx is needed.  Pt expressed understanding and is in agreement w/ plan.

## 2016-01-27 NOTE — Progress Notes (Signed)
   Subjective:    Patient ID: Connie Gross, female    DOB: 1955/11/21, 60 y.o.   MRN: [id number]  HPI HTN- pt was started on BP meds when she was hospitalized.  Once her pain improved, BP normalized.  meds were stopped at last visit.  BP remains well controlled today.  No CP, SOB, HAs, visual changes, edema.  Glucose intolerance- pt's last A1C was elevated at 6.5  Exercising regularly.  Has made dietary changes.    Back pain- pt re-injured her back recently while pulling weeds in the yard.  Back pain improved w/ Thermacare wraps and Aleve.  Pt is wondering if she should restart PT for additional strengthening exercises.     Review of Systems For ROS see HPI     Objective:   Physical Exam  Constitutional: She is oriented to person, place, and time. She appears well-developed and well-nourished. No distress.  HENT:  Head: Normocephalic and atraumatic.  Eyes: Conjunctivae and EOM are normal. Pupils are equal, round, and reactive to light.  Neck: Normal range of motion. Neck supple. No thyromegaly present.  Cardiovascular: Normal rate, regular rhythm, normal heart sounds and intact distal pulses.   No murmur heard. Pulmonary/Chest: Effort normal and breath sounds normal. No respiratory distress.  Abdominal: Soft. She exhibits no distension. There is no tenderness.  Musculoskeletal: She exhibits no edema.  Lymphadenopathy:    She has no cervical adenopathy.  Neurological: She is alert and oriented to person, place, and time.  Skin: Skin is warm and dry.  Psychiatric: She has a normal mood and affect. Her behavior is normal.  Vitals reviewed.         Assessment & Plan:

## 2016-01-27 NOTE — Assessment & Plan Note (Signed)
Resolved.  Pt's BP remains at goal off the medication.  Applauded her efforts.  Will continue to follow and will restart meds in the future if needed.  Pt expressed understanding and is in agreement w/ plan.

## 2016-01-27 NOTE — Assessment & Plan Note (Signed)
Recurrent issue for pt.  She is improving w/ Thermacare and Aleve but she would like to return to PT for additional strengthening.  Referral placed.

## 2016-01-27 NOTE — Progress Notes (Signed)
Pre visit review using our clinic review tool, if applicable. No additional management support is needed unless otherwise documented below in the visit note. 

## 2016-01-28 ENCOUNTER — Encounter: Payer: Self-pay | Admitting: General Practice

## 2016-01-28 LAB — BASIC METABOLIC PANEL
BUN: 13 mg/dL (ref 7–25)
CALCIUM: 8.8 mg/dL (ref 8.6–10.4)
CO2: 28 mmol/L (ref 20–31)
CREATININE: 0.84 mg/dL (ref 0.50–0.99)
Chloride: 105 mmol/L (ref 98–110)
Glucose, Bld: 86 mg/dL (ref 65–99)
Potassium: 4 mmol/L (ref 3.5–5.3)
Sodium: 141 mmol/L (ref 135–146)

## 2016-02-18 ENCOUNTER — Telehealth: Payer: Self-pay | Admitting: General Practice

## 2016-02-18 NOTE — Telephone Encounter (Signed)
aperwork received from [facility]- Pt plan of care. Signed by PCP and faxed on 02/10/16.

## 2016-05-13 ENCOUNTER — Ambulatory Visit: Payer: BC Managed Care – PPO | Admitting: Podiatry

## 2016-06-03 ENCOUNTER — Other Ambulatory Visit: Payer: Self-pay | Admitting: Family Medicine

## 2016-12-08 ENCOUNTER — Other Ambulatory Visit: Payer: Self-pay | Admitting: Family Medicine

## 2017-06-10 ENCOUNTER — Other Ambulatory Visit: Payer: Self-pay | Admitting: Family Medicine

## 2017-09-17 IMAGING — CR DG CHEST 2V
2 series · 2 of 2 positions shown · non-contrast
Comparison: None.

CLINICAL DATA: 59-year-old female with acute chest pain today.

EXAM:
CHEST  2 VIEW

[chest pa]
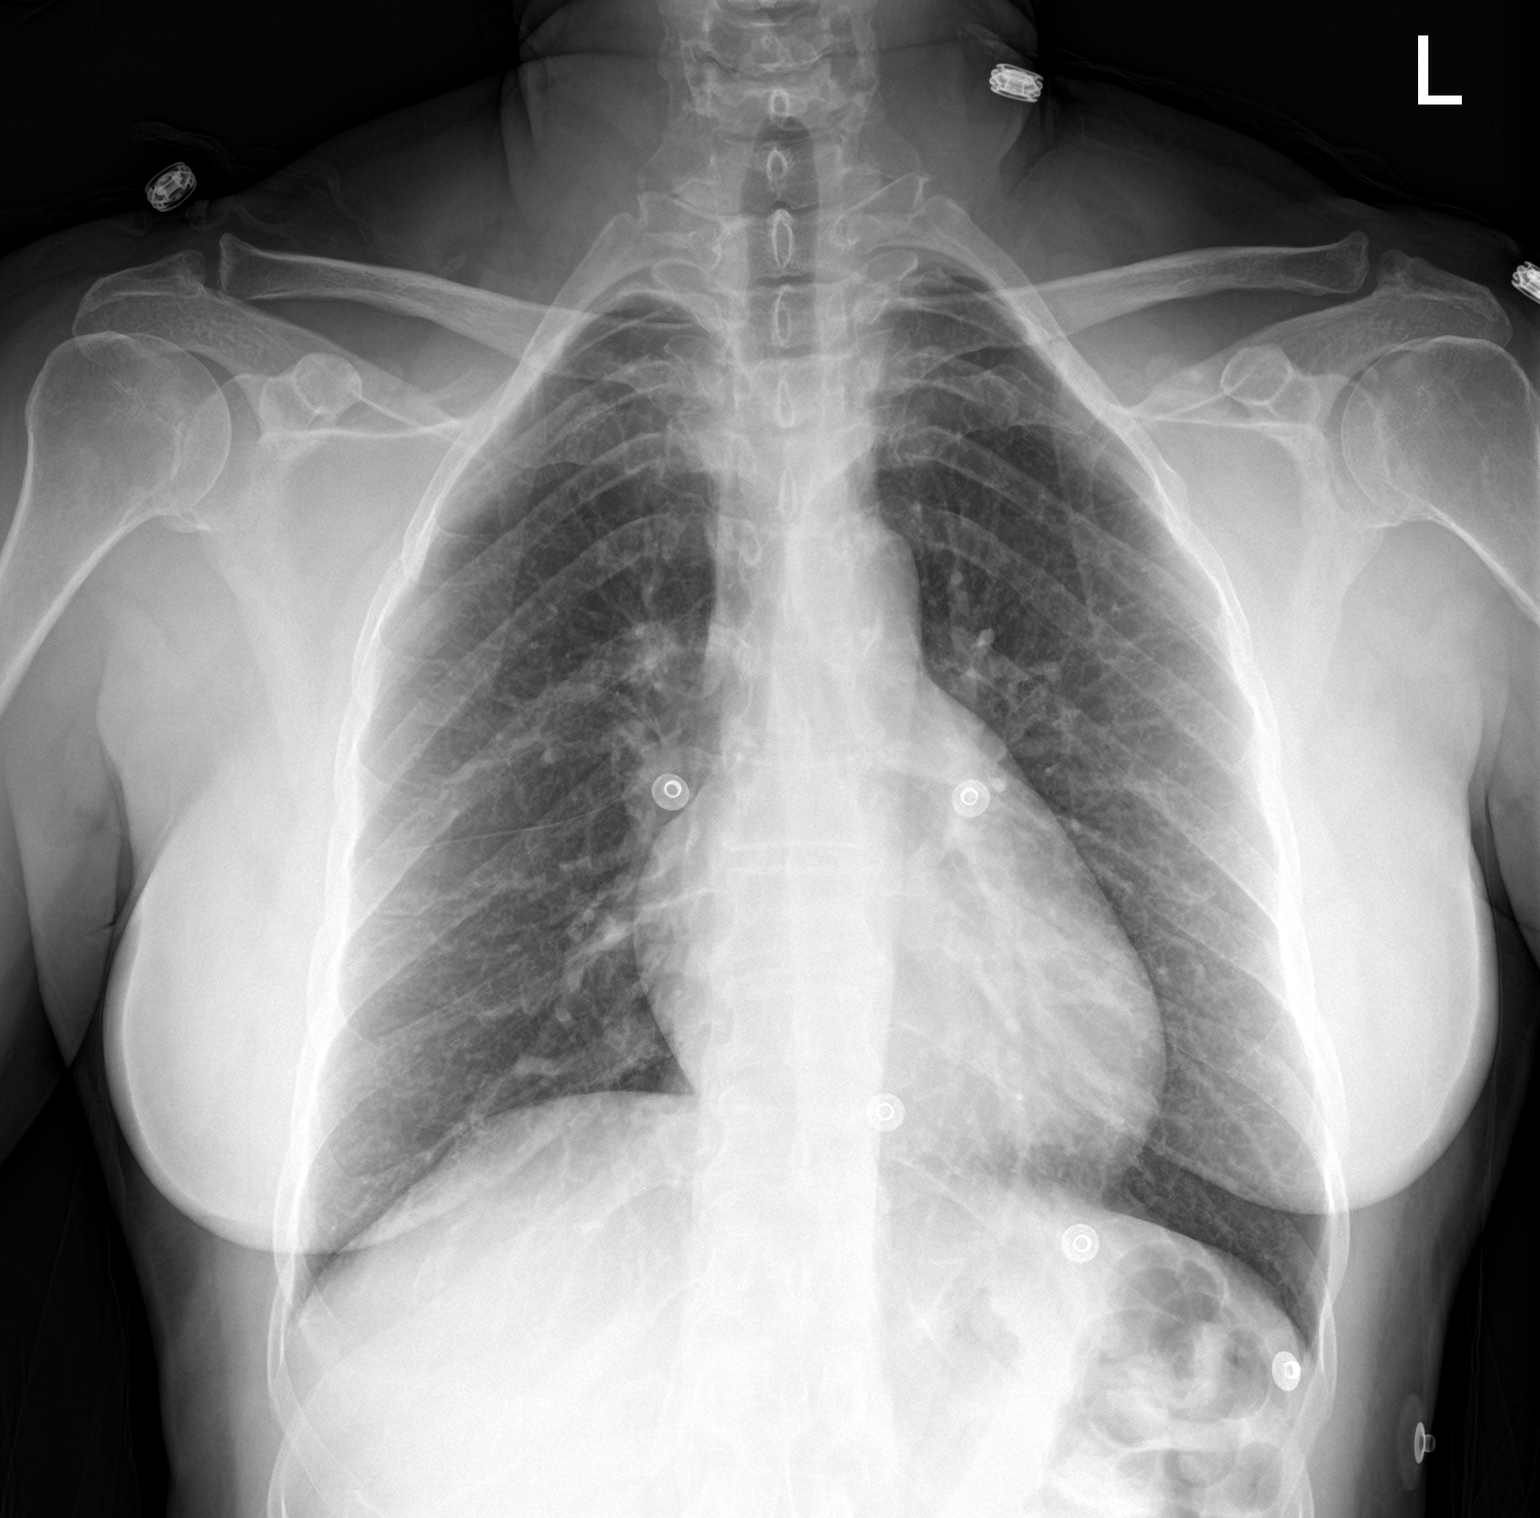

[chest lat]
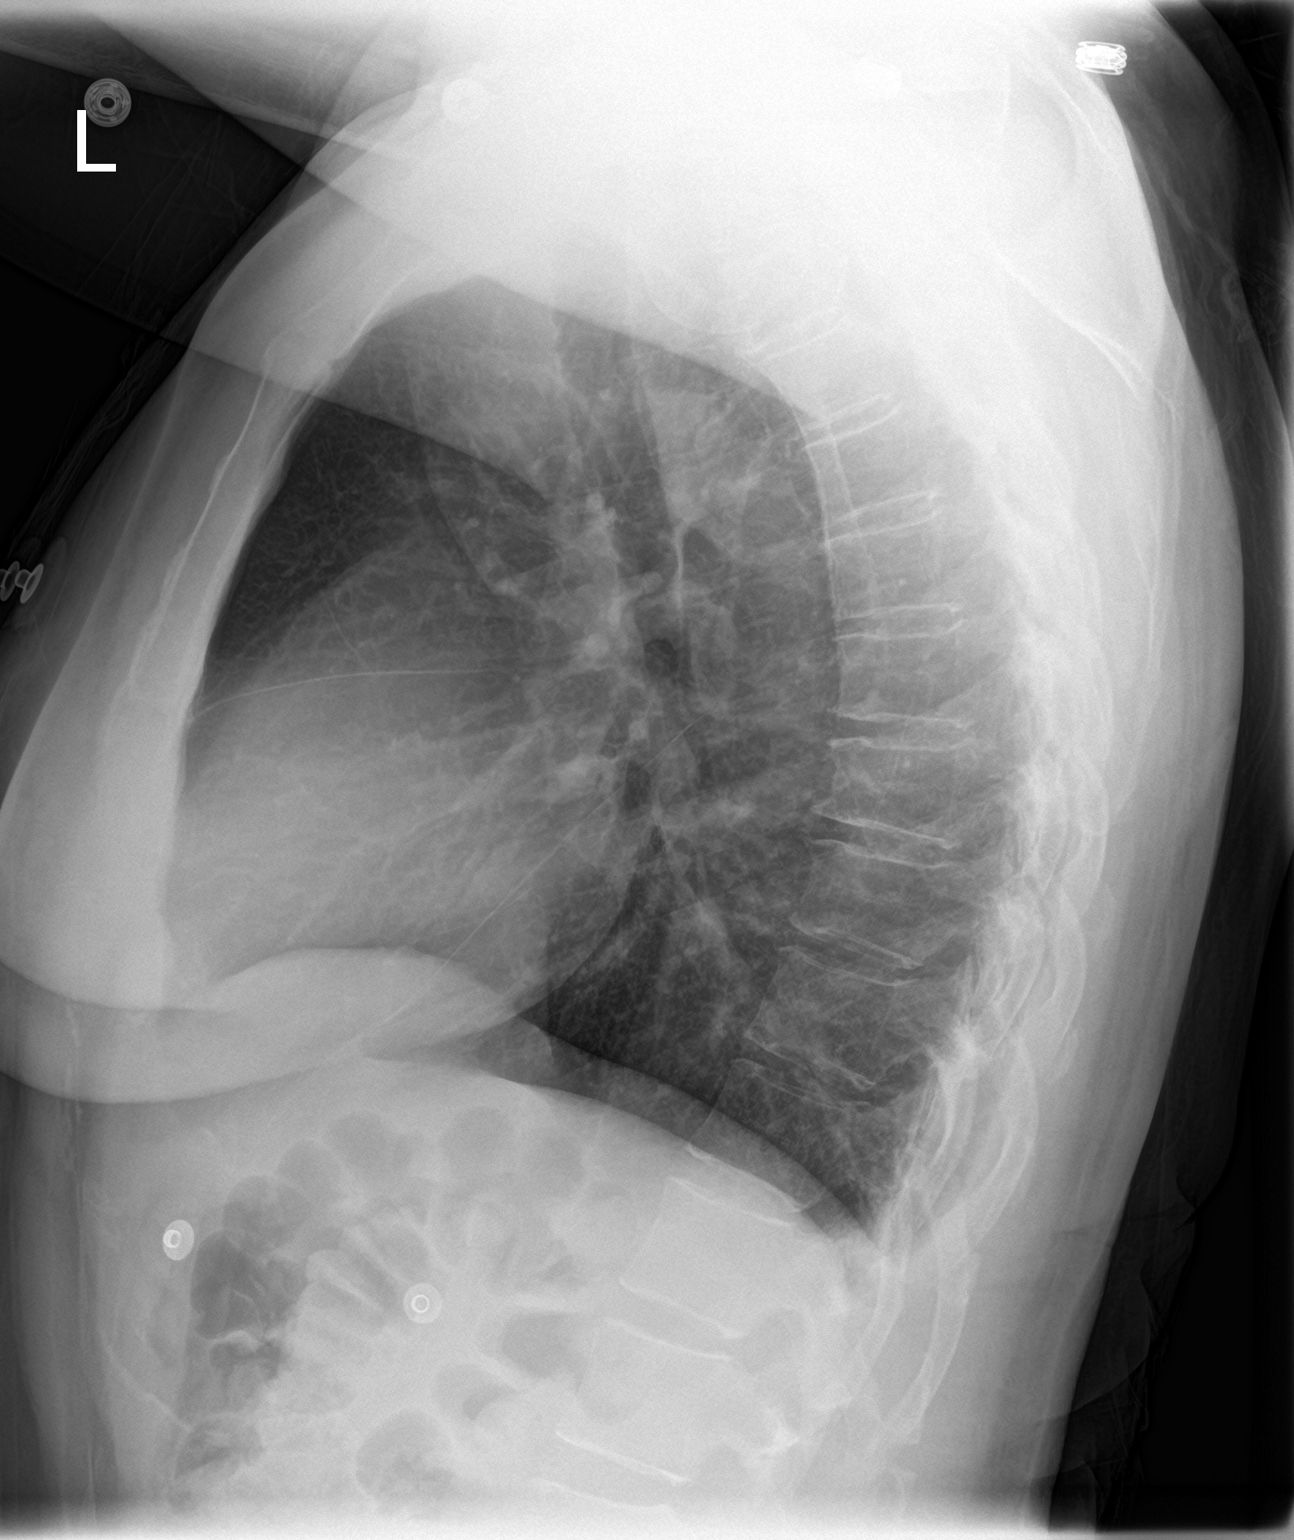

[2 of 2 positions shown; findings below may reference images not displayed]

FINDINGS: The cardiomediastinal silhouette is unremarkable.

There is no evidence of focal airspace disease, pulmonary edema,
suspicious pulmonary nodule/mass, pleural effusion, or pneumothorax.
No acute bony abnormalities are identified.
IMPRESSION: No active cardiopulmonary disease.

## 2017-09-20 ENCOUNTER — Other Ambulatory Visit: Payer: Self-pay | Admitting: Family Medicine

## 2018-02-08 ENCOUNTER — Encounter: Payer: Self-pay | Admitting: General Practice

## 2018-04-06 DIAGNOSIS — I340 Nonrheumatic mitral (valve) insufficiency: Secondary | ICD-10-CM | POA: Insufficient documentation

## 2020-02-02 DIAGNOSIS — J309 Allergic rhinitis, unspecified: Secondary | ICD-10-CM | POA: Insufficient documentation

## 2020-10-03 ENCOUNTER — Other Ambulatory Visit: Payer: Medicare PPO

## 2020-10-03 DIAGNOSIS — Z20822 Contact with and (suspected) exposure to covid-19: Secondary | ICD-10-CM

## 2020-10-05 LAB — SARS-COV-2, NAA 2 DAY TAT

## 2020-10-05 LAB — NOVEL CORONAVIRUS, NAA: SARS-CoV-2, NAA: NOT DETECTED

## 2021-03-19 DIAGNOSIS — H401111 Primary open-angle glaucoma, right eye, mild stage: Secondary | ICD-10-CM | POA: Insufficient documentation

## 2022-04-01 ENCOUNTER — Emergency Department (HOSPITAL_BASED_OUTPATIENT_CLINIC_OR_DEPARTMENT_OTHER): Payer: Medicare PPO

## 2022-04-01 ENCOUNTER — Other Ambulatory Visit: Payer: Self-pay

## 2022-04-01 ENCOUNTER — Emergency Department (HOSPITAL_BASED_OUTPATIENT_CLINIC_OR_DEPARTMENT_OTHER)
Admission: EM | Admit: 2022-04-01 | Discharge: 2022-04-01 | Disposition: A | Discharge status: Home / Self Care | Payer: Medicare PPO | Attending: Emergency Medicine | Admitting: Emergency Medicine

## 2022-04-01 ENCOUNTER — Encounter (HOSPITAL_BASED_OUTPATIENT_CLINIC_OR_DEPARTMENT_OTHER): Payer: Self-pay | Admitting: Emergency Medicine

## 2022-04-01 DIAGNOSIS — Z7982 Long term (current) use of aspirin: Secondary | ICD-10-CM | POA: Diagnosis not present

## 2022-04-01 DIAGNOSIS — M25512 Pain in left shoulder: Secondary | ICD-10-CM | POA: Diagnosis present

## 2022-04-01 MED ORDER — IBUPROFEN 400 MG PO TABS
600.0000 mg | ORAL_TABLET | Freq: Once | ORAL | Status: AC
  Administered 2022-04-01: 600 mg via ORAL
  Filled 2022-04-01: qty 1

## 2022-04-01 MED ORDER — OXYCODONE-ACETAMINOPHEN 5-325 MG PO TABS
1.0000 | ORAL_TABLET | Freq: Once | ORAL | Status: AC
  Administered 2022-04-01: 1 via ORAL
  Filled 2022-04-01: qty 1

## 2022-04-01 MED ORDER — OXYCODONE-ACETAMINOPHEN 5-325 MG PO TABS: 1.0000 | ORAL_TABLET | Freq: Four times a day (QID) | ORAL | 0 refills | Status: DC | PRN

## 2022-04-01 NOTE — ED Triage Notes (Signed)
Pt reports left shoulder pain since Monday. Denies injury. Movement worsens pain. Attempted to get a massage to relieve pain. No improvement. States this is a recurrent issue.

## 2022-04-01 NOTE — ED Provider Notes (Signed)
[facility] EMERGENCY DEPARTMENT Provider Note   CSN: [id number] Arrival date & time: 04/01/22  1249     History  Chief Complaint  Patient presents with   Shoulder Pain    Connie Gross is a 66 y.o. female.  Patient presents.  Describes it as being sharp and worse when she moves her arm out of the leg.  Denies any specific fall or trauma that she can recall.  No fevers or cough or vomiting or diarrhea.  No reports of weakness or numbness.  No reports of chest pain       Home Medications Prior to Admission medications   Medication Sig Start Date End Date Taking? Authorizing Provider  oxyCODONE-acetaminophen (PERCOCET/ROXICET) 5-325 MG tablet Take 1 tablet by mouth every 6 (six) hours as needed for up to 7 doses for severe pain. 04/01/22  Yes Cheryll Cockayne, MD  aspirin EC 81 MG EC tablet Take 1 tablet (81 mg total) by mouth daily. 10/20/15   Mikhail, Nita Sells, DO  Calcium Carbonate-Vit D-Min (CALCIUM 1200 PO) Take 1 capsule by mouth 3 (three) times daily.    [provider]  cholecalciferol (VITAMIN D) 1000 UNITS tablet Take 1,000 Units by mouth 2 (two) times daily.    [provider]  cyclobenzaprine (FLEXERIL) 5 MG tablet Take 1 tablet (5 mg total) by mouth 3 (three) times daily as needed for muscle spasms. 10/14/15   Mabe, Latanya Maudlin, MD  fish oil-omega-3 fatty acids 1000 MG capsule Take 2 g by mouth daily as needed.     [provider]  Multiple Vitamin (MULTIVITAMIN) tablet Take 1 tablet by mouth daily. Reported on 10/17/2015    [provider]  omeprazole (PRILOSEC) 40 MG capsule Take 1 capsule (40 mg total) by mouth daily. 01/27/16   Sheliah Hatch, MD  ranitidine (ZANTAC) 300 MG tablet Take 1 tablet (300 mg total) by mouth at bedtime. Please call [phone] to schedule a cholesterol follow up. 09/22/17   Sheliah Hatch, MD  simvastatin (ZOCOR) 10 MG tablet Take 5 mg by mouth at bedtime.    [provider]      Allergies     Prednisone    Review of Systems   Review of Systems  Constitutional:  Negative for fever.  HENT:  Negative for ear pain.   Eyes:  Negative for pain.  Respiratory:  Negative for cough.   Cardiovascular:  Negative for chest pain.  Gastrointestinal:  Negative for abdominal pain.  Genitourinary:  Negative for flank pain.  Musculoskeletal:  Negative for back pain.  Skin:  Negative for rash.  Neurological:  Negative for headaches.    Physical Exam Updated Vital Signs BP (!) 143/62   Pulse 84   Temp 98.7 F (37.1 C) (Oral)   Resp 16   Ht 5\' 1"  (1.549 m)   Wt 63.5 kg   SpO2 97%   BMI 26.45 kg/m  Physical Exam Constitutional:      General: She is not in acute distress.    Appearance: Normal appearance.  HENT:     Head: Normocephalic.     Nose: Nose normal.  Eyes:     Extraocular Movements: Extraocular movements intact.  Cardiovascular:     Rate and Rhythm: Normal rate.  Pulmonary:     Effort: Pulmonary effort is normal.  Musculoskeletal:        General: Normal range of motion.     Cervical back: Normal range of motion.  Neurological:  General: No focal deficit present.     Mental Status: She is alert. Mental status is at baseline.     Cranial Nerves: No cranial nerve deficit.     Motor: No weakness.     Gait: Gait normal.     Comments: Intact range of motion of the left shoulder, flexion extension internal/external rotation     ED Results / Procedures / Treatments   Labs (all labs ordered are listed, but only abnormal results are displayed) Labs Reviewed - No data to display  EKG None  Radiology DG Shoulder Left  Result Date: 04/01/2022 CLINICAL DATA:  Pain left shoulder EXAM: LEFT SHOULDER - 2+ VIEW COMPARISON:  10/27/2016 FINDINGS: No fracture or dislocation is seen. There are no abnormal soft tissue calcifications. Smoothly marginated calcification noted adjacent to the glenoid in the previous study could not be identified in the current study. Small  bony spurs are seen in left AC joint. IMPRESSION: No fracture or dislocation is seen in left shoulder. There are no abnormal soft tissue calcifications. Degenerative changes with bony spurs are noted in the left [facility] joint. Electronically Signed   By: Ernie Avena M.D.   On: 04/01/2022 13:32    Procedures Procedures    Medications Ordered in ED Medications  oxyCODONE-acetaminophen (PERCOCET/ROXICET) 5-325 MG per tablet 1 tablet (has no administration in time range)  ibuprofen (ADVIL) tablet 600 mg (has no administration in time range)    ED Course/ Medical Decision Making/ A&P                           Medical Decision Making Amount and/or Complexity of Data Reviewed Radiology: ordered.  Risk Prescription drug management.   Medications office visit including complexity  Subjective   No complaints unremarkable exam is benign.  Extremities neurovascularly intact compartments soft  X-rays unremarkable other pathology    This week advised return for worsening symptoms or any additional concerns.        Final Clinical Impression(s) / ED Diagnoses Final diagnoses:  Acute pain of left shoulder    Rx / DC Orders ED Discharge Orders          Ordered    oxyCODONE-acetaminophen (PERCOCET/ROXICET) 5-325 MG tablet  Every 6 hours PRN        04/01/22 1537              Cheryll Cockayne, MD 04/01/22 1537

## 2022-04-01 NOTE — Discharge Instructions (Signed)
Call your primary care doctor or specialist as discussed in the next 2-3 days.   Return immediately back to the ER if:  Your symptoms worsen within the next 12-24 hours. You develop new symptoms such as new fevers, persistent vomiting, new pain, shortness of breath, or new weakness or numbness, or if you have any other concerns.  

## 2022-10-12 ENCOUNTER — Emergency Department (HOSPITAL_BASED_OUTPATIENT_CLINIC_OR_DEPARTMENT_OTHER)
Admission: EM | Admit: 2022-10-12 | Discharge: 2022-10-12 | Disposition: A | Discharge status: Home / Self Care | Payer: Medicare PPO | Attending: Emergency Medicine | Admitting: Emergency Medicine

## 2022-10-12 ENCOUNTER — Other Ambulatory Visit: Payer: Self-pay

## 2022-10-12 ENCOUNTER — Emergency Department (HOSPITAL_BASED_OUTPATIENT_CLINIC_OR_DEPARTMENT_OTHER): Payer: Medicare PPO

## 2022-10-12 ENCOUNTER — Encounter (HOSPITAL_BASED_OUTPATIENT_CLINIC_OR_DEPARTMENT_OTHER): Payer: Self-pay

## 2022-10-12 DIAGNOSIS — Z7982 Long term (current) use of aspirin: Secondary | ICD-10-CM | POA: Diagnosis not present

## 2022-10-12 DIAGNOSIS — R55 Syncope and collapse: Secondary | ICD-10-CM | POA: Diagnosis not present

## 2022-10-12 DIAGNOSIS — Z20822 Contact with and (suspected) exposure to covid-19: Secondary | ICD-10-CM | POA: Diagnosis not present

## 2022-10-12 LAB — BASIC METABOLIC PANEL
Anion gap: 8 (ref 5–15)
BUN: 10 mg/dL (ref 8–23)
CO2: 28 mmol/L (ref 22–32)
Calcium: 9 mg/dL (ref 8.9–10.3)
Chloride: 101 mmol/L (ref 98–111)
Creatinine, Ser: 0.9 mg/dL (ref 0.44–1.00)
GFR, Estimated: >60 mL/min (ref >60)
Glucose, Bld: 115 mg/dL — ABNORMAL HIGH (ref 70–99)
Potassium: 3.4 mmol/L — ABNORMAL LOW (ref 3.5–5.1)
Sodium: 137 mmol/L (ref 135–145)

## 2022-10-12 LAB — URINALYSIS, ROUTINE W REFLEX MICROSCOPIC
Bilirubin Urine: NEGATIVE
Glucose, UA: NEGATIVE mg/dL
Hgb urine dipstick: NEGATIVE
Ketones, ur: NEGATIVE mg/dL
Leukocytes,Ua: NEGATIVE
Nitrite: NEGATIVE
Protein, ur: NEGATIVE mg/dL
Specific Gravity, Urine: 1.015 (ref 1.005–1.030)
pH: 7.5 (ref 5.0–8.0)

## 2022-10-12 LAB — CBC
HCT: 40.3 % (ref 36.0–46.0)
Hemoglobin: 13 g/dL (ref 12.0–15.0)
MCH: 27.1 pg (ref 26.0–34.0)
MCHC: 32.3 g/dL (ref 30.0–36.0)
MCV: 84 fL (ref 80.0–100.0)
Platelets: 258 10*3/uL (ref 150–400)
RBC: 4.8 MIL/uL (ref 3.87–5.11)
RDW: 14.1 % (ref 11.5–15.5)
WBC: 9.5 10*3/uL (ref 4.0–10.5)
nRBC: 0 % (ref 0.0–0.2)

## 2022-10-12 LAB — RESP PANEL BY RT-PCR (RSV, FLU A&B, COVID)  RVPGX2
Influenza A by PCR: NEGATIVE
Influenza B by PCR: NEGATIVE
Resp Syncytial Virus by PCR: NEGATIVE
SARS Coronavirus 2 by RT PCR: NEGATIVE

## 2022-10-12 LAB — GROUP A STREP BY PCR: Group A Strep by PCR: NOT DETECTED

## 2022-10-12 MED ORDER — POTASSIUM CHLORIDE CRYS ER 20 MEQ PO TBCR
40.0000 meq | EXTENDED_RELEASE_TABLET | Freq: Once | ORAL | Status: AC
  Administered 2022-10-12: 40 meq via ORAL
  Filled 2022-10-12: qty 2

## 2022-10-12 NOTE — ED Triage Notes (Signed)
States was in the bathroom on the toilet this morning and had syncopal episode. States woke up between toilet and tub. Denies hitting head. States feels sore now however denies dizziness/sob/cp. States was not straining when going to restroom.

## 2022-10-12 NOTE — ED Notes (Signed)
Pt. Had taken castor oil for BM yesterday and during a BM today she passed out per pt.

## 2022-10-12 NOTE — ED Provider Notes (Signed)
[facility] Provider Note   CSN: [id number] Arrival date & time: 10/12/22  1605     History  Chief Complaint  Patient presents with   Loss of Consciousness    Connie Gross is a 67 y.o. female.   Loss of Consciousness    Pt was in the bathroom today.  She was feeling a little nauseous.  She then felt flushed and hot.  Since then passed out.  She is not sure how long for.  Since then she has felt a little weak but no recurrent episodes.  No CP or shortness of breath.  No injuries in the fall.  No blood in stool.  No prior of same.  Home Medications Prior to Admission medications   Medication Sig Start Date End Date Taking? Authorizing Provider  aspirin EC 81 MG EC tablet Take 1 tablet (81 mg total) by mouth daily. 10/20/15   Mikhail, Velta Addison, DO  Calcium Carbonate-Vit D-Min (CALCIUM 1200 PO) Take 1 capsule by mouth 3 (three) times daily.    [provider]  cholecalciferol (VITAMIN D) 1000 UNITS tablet Take 1,000 Units by mouth 2 (two) times daily.    [provider]  cyclobenzaprine (FLEXERIL) 5 MG tablet Take 1 tablet (5 mg total) by mouth 3 (three) times daily as needed for muscle spasms. 10/14/15   Mabe, Forbes Cellar, MD  fish oil-omega-3 fatty acids 1000 MG capsule Take 2 g by mouth daily as needed.     [provider]  Multiple Vitamin (MULTIVITAMIN) tablet Take 1 tablet by mouth daily. Reported on 10/17/2015    [provider]  omeprazole (PRILOSEC) 40 MG capsule Take 1 capsule (40 mg total) by mouth daily. 01/27/16   Midge Minium, MD  oxyCODONE-acetaminophen (PERCOCET/ROXICET) 5-325 MG tablet Take 1 tablet by mouth every 6 (six) hours as needed for up to 7 doses for severe pain. 04/01/22   Luna Fuse, MD  ranitidine (ZANTAC) 300 MG tablet Take 1 tablet (300 mg total) by mouth at bedtime. Please call [phone] to schedule a cholesterol follow up. 09/22/17   Midge Minium, MD  simvastatin  (ZOCOR) 10 MG tablet Take 5 mg by mouth at bedtime.    [provider]      Allergies    Prednisone    Review of Systems   Review of Systems  Cardiovascular:  Positive for syncope.    Physical Exam Updated Vital Signs BP (!) 140/65   Pulse 67   Temp 98.6 F (37 C) (Oral)   Resp 17   Ht 1.524 m (5')   Wt 60.3 kg   SpO2 100%   BMI 25.97 kg/m  Physical Exam Vitals and nursing note reviewed.  Constitutional:      General: She is not in acute distress.    Appearance: She is well-developed.  HENT:     Head: Normocephalic and atraumatic.     Right Ear: External ear normal.     Left Ear: External ear normal.  Eyes:     General: No scleral icterus.       Right eye: No discharge.        Left eye: No discharge.     Conjunctiva/sclera: Conjunctivae normal.  Neck:     Trachea: No tracheal deviation.  Cardiovascular:     Rate and Rhythm: Normal rate and regular rhythm.  Pulmonary:     Effort: Pulmonary effort is normal. No respiratory distress.     Breath  sounds: Normal breath sounds. No stridor. No wheezing or rales.  Abdominal:     General: Bowel sounds are normal. There is no distension.     Palpations: Abdomen is soft.     Tenderness: There is no abdominal tenderness. There is no guarding or rebound.  Musculoskeletal:        General: No tenderness or deformity.     Cervical back: Neck supple.  Skin:    General: Skin is warm and dry.     Findings: No rash.  Neurological:     General: No focal deficit present.     Mental Status: She is alert.     Cranial Nerves: No cranial nerve deficit, dysarthria or facial asymmetry.     Sensory: No sensory deficit.     Motor: No abnormal muscle tone or seizure activity.     Coordination: Coordination normal.  Psychiatric:        Mood and Affect: Mood normal.     ED Results / Procedures / Treatments   Labs (all labs ordered are listed, but only abnormal results are displayed) Labs Reviewed  BASIC METABOLIC PANEL -  Abnormal; Notable for the following components:      Result Value   Potassium 3.4 (*)    Glucose, Bld 115 (*)    All other components within normal limits  RESP PANEL BY RT-PCR (RSV, FLU A&B, COVID)  RVPGX2  GROUP A STREP BY PCR  CBC  URINALYSIS, ROUTINE W REFLEX MICROSCOPIC    EKG EKG Interpretation  Date/Time:  Monday October 12 2022 16:20:33 EST Ventricular Rate:  81 PR Interval:  148 QRS Duration: 84 QT Interval:  376 QTC Calculation: 437 R Axis:   63 Text Interpretation: Sinus rhythm Probable left atrial enlargement t wave changes resolved since last tracing Confirmed by Linwood Dibbles [phone]) on 10/12/2022 5:19:42 PM  Radiology CT Head Wo Contrast  Result Date: 10/12/2022 CLINICAL DATA:  Syncopal episode EXAM: CT HEAD WITHOUT CONTRAST TECHNIQUE: Contiguous axial images were obtained from the base of the skull through the vertex without intravenous contrast. RADIATION DOSE REDUCTION: This exam was performed according to the departmental dose-optimization program which includes automated exposure control, adjustment of the mA and/or kV according to patient size and/or use of iterative reconstruction technique. COMPARISON:  None Available. FINDINGS: Brain: No evidence of acute infarction, hemorrhage, mass, mass effect, or midline shift. No hydrocephalus or extra-axial fluid collection. Vascular: No hyperdense vessel. Skull: Negative for fracture or focal lesion. Sinuses/Orbits: Mild mucosal thickening in the right greater than left maxillary sinus. No acute finding in the orbits. Status post bilateral lens replacements. Other: The mastoid air cells are well aerated. IMPRESSION: No acute intracranial process. Electronically Signed   By: Wiliam Ke M.D.   On: 10/12/2022 18:24    Procedures Procedures    Medications Ordered in ED Medications  potassium chloride SA (KLOR-CON M) CR tablet 40 mEq (40 mEq Oral Given 10/12/22 1926)    ED Course/ Medical Decision Making/ A&P                              Medical Decision Making Amount and/or Complexity of Data Reviewed Labs: ordered.    Details: No acute abnormalities noted Radiology: ordered and independent interpretation performed.    Details: Head CT without acute findings   Patient presented to the ED for evaluation after a syncopal episode.  Episode does sound vasovagal in nature.  Patient did have a  prodrome and felt warm and flushed.  She was on the commode when this occurred.  ED workup reassuring.  She is not dehydrated.  No anemia.  Patient denies any injuries.  No acute abnormalities noted on head CT.  No signs of covid influenza or UTI.  Patient was also strep negative.  Patient's not had any recurrent episodes since she has been here.  She was initially noted to be hypertensive but repeat blood pressure is more reassuring with only a slightly elevated systolic blood pressure with a do not think attributed to this event.  Patient does not have any history of cardiac disease.  I think she appears stable for outpatient follow-up with cardiology, possible echocardiogram or outpatient cardiac monitoring.  Discussed return to the ED for any recurrent episodes.  Evaluation and diagnostic testing in the emergency department does not suggest an emergent condition requiring admission or immediate intervention beyond what has been performed at this time.  The patient is safe for discharge and has been instructed to return immediately for worsening symptoms, change in symptoms or any other concerns.        Final Clinical Impression(s) / ED Diagnoses Final diagnoses:  Syncope, unspecified syncope type    Rx / DC Orders ED Discharge Orders          Ordered    Ambulatory referral to Cardiology       Comments: If you have not heard from the Cardiology office within the next 72 hours please call [phone].   10/12/22 1936              Dorie Rank, MD 10/12/22 [phone]

## 2022-10-12 NOTE — ED Provider Triage Note (Signed)
Emergency Medicine Provider Triage Evaluation Note  Connie Gross , a 67 y.o. female  was evaluated in triage.  Pt complains of syncopal episode this morning while sitting on the toilet. Woke up on the floor, does not remember what happened. Sore throat and congestion x several days as well. No fever, chills, nausea, vomiting, diarrhea, bloody stools. No history of this. No chest pain or SOB.   Review of Systems  Positive: See HPI Negative: See HPI  Physical Exam  BP (!) 144/125 (BP Location: Left Arm)   Pulse 83   Temp 98.6 F (37 C) (Oral)   Resp 16   Ht 5' (1.524 m)   Wt 60.3 kg   SpO2 100%   BMI 25.97 kg/m  Gen:   Awake, no distress   Resp:  Normal effort LCTA MSK:   Moves extremities without difficulty  Other:  Posterior pharyngeal erythema, no exudates, uvula midline, abdomen soft and nontender, alert and oriented, no focal neurological deficits, normal speech  Medical Decision Making  Medically screening exam initiated at 5:38 PM.  Appropriate orders placed.  Connie Gross was informed that the remainder of the evaluation will be completed by another provider, this initial triage assessment does not replace that evaluation, and the importance of remaining in the ED until their evaluation is complete.     Suzzette Righter, PA-C 10/12/22 1745

## 2022-10-12 NOTE — Discharge Instructions (Addendum)
Follow-up with your primary doctor in cardiologist for further evaluation of your syncopal episode.  Return to the ED for any recurrent symptoms.

## 2022-11-12 ENCOUNTER — Other Ambulatory Visit: Payer: Self-pay

## 2022-11-12 DIAGNOSIS — M8580 Other specified disorders of bone density and structure, unspecified site: Secondary | ICD-10-CM | POA: Insufficient documentation

## 2022-11-12 DIAGNOSIS — E559 Vitamin D deficiency, unspecified: Secondary | ICD-10-CM | POA: Insufficient documentation

## 2022-11-12 DIAGNOSIS — E7800 Pure hypercholesterolemia, unspecified: Secondary | ICD-10-CM | POA: Insufficient documentation

## 2022-11-17 ENCOUNTER — Ambulatory Visit: Payer: Medicare PPO | Attending: Cardiology | Admitting: Cardiology

## 2022-11-17 ENCOUNTER — Telehealth: Payer: Self-pay | Admitting: Cardiology

## 2022-11-17 ENCOUNTER — Encounter: Payer: Self-pay | Admitting: Cardiology

## 2022-11-17 VITALS — BP 114/64 | HR 69 | Ht 60.0 in | Wt 133.1 lb

## 2022-11-17 DIAGNOSIS — R55 Syncope and collapse: Secondary | ICD-10-CM | POA: Insufficient documentation

## 2022-11-17 DIAGNOSIS — E782 Mixed hyperlipidemia: Secondary | ICD-10-CM

## 2022-11-17 DIAGNOSIS — R011 Cardiac murmur, unspecified: Secondary | ICD-10-CM | POA: Insufficient documentation

## 2022-11-17 HISTORY — DX: Syncope and collapse: R55
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
## 2022-11-17 NOTE — Telephone Encounter (Signed)
Same call in another call.

## 2022-11-17 NOTE — Telephone Encounter (Signed)
Connie Gross called and said she forgot to tell you that she sometimes has pain in her neck, left arm and left side pain that feels like a pulled muscle- she uses the heating pad and Tylenol and it subsides. She forgot to tell you when she was there.

## 2022-11-17 NOTE — Telephone Encounter (Signed)
Pt c/o of Chest Pain: STAT if CP now or developed within 24 hours  1. Are you having CP right now? yes  2. Are you experiencing any other symptoms (ex. SOB, nausea, vomiting, sweating)? no  3. How long have you been experiencing CP? A couple days  4. Is your CP continuous or coming and going? Comes and goes  5. Have you taken Nitroglycerin? No  Patient states she forgot to mention she has been getting chest pains. She says she will usually get it when she lays on her back, but does feel it now. She says it started again after she left the office. She says she is not sure if it is a muscle, because it also feels like heart burn.  ?

## 2022-11-17 NOTE — Patient Instructions (Signed)
Medication Instructions:  Your physician has recommended you make the following change in your medication:   Stop Metoprolol.  *If you need a refill on your cardiac medications before your next appointment, please call your pharmacy*   Lab Work: None ordered If you have labs (blood work) drawn today and your tests are completely normal, you will receive your results only by: [facility] (if you have MyChart) OR A paper copy in the mail If you have any lab test that is abnormal or we need to change your treatment, we will call you to review the results.   Testing/Procedures: Your physician has requested that you have an echocardiogram. Echocardiography is a painless test that uses sound waves to create images of your heart. It provides your doctor with information about the size and shape of your heart and how well your heart's chambers and valves are working. This procedure takes approximately one hour. There are no restrictions for this procedure. Please do NOT wear cologne, perfume, aftershave, or lotions (deodorant is allowed). Please arrive 15 minutes prior to your appointment time.     Follow-Up: At [facility], you and your health needs are our priority.  As part of our continuing mission to provide you with exceptional heart care, we have created designated Provider Care Teams.  These Care Teams include your primary Cardiologist (physician) and Advanced Practice Providers (APPs -  Physician Assistants and Nurse Practitioners) who all work together to provide you with the care you need, when you need it.  We recommend signing up for the patient portal called "MyChart".  Sign up information is provided on this After Visit Summary.  MyChart is used to connect with patients for Virtual Visits (Telemedicine).  Patients are able to view lab/test results, encounter notes, upcoming appointments, etc.  Non-urgent messages can be sent to your provider as well.   To learn more about  what you can do with MyChart, go to [email or web address].    Your next appointment:   9 month(s)  The format for your next appointment:   In Person  Provider:   Jyl Heinz, MD   Other Instructions Echocardiogram An echocardiogram is a test that uses sound waves (ultrasound) to produce images of the heart. Images from an echocardiogram can provide important information about: Heart size and shape. The size and thickness and movement of your heart's walls. Heart muscle function and strength. Heart valve function or if you have stenosis. Stenosis is when the heart valves are too narrow. If blood is flowing backward through the heart valves (regurgitation). A tumor or infectious growth around the heart valves. Areas of heart muscle that are not working well because of poor blood flow or injury from a heart attack. Aneurysm detection. An aneurysm is a weak or damaged part of an artery wall. The wall bulges out from the normal force of blood pumping through the body. Tell a health care provider about: Any allergies you have. All medicines you are taking, including vitamins, herbs, eye drops, creams, and over-the-counter medicines. Any blood disorders you have. Any surgeries you have had. Any medical conditions you have. Whether you are pregnant or may be pregnant. What are the risks? Generally, this is a safe test. However, problems may occur, including an allergic reaction to dye (contrast) that may be used during the test. What happens before the test? No specific preparation is needed. You may eat and drink normally. What happens during the test? You will take off your clothes from the waist  up and put on a hospital gown. Electrodes or electrocardiogram (ECG)patches may be placed on your chest. The electrodes or patches are then connected to a device that monitors your heart rate and rhythm. You will lie down on a table for an ultrasound exam. A gel will be applied to your  chest to help sound waves pass through your skin. A handheld device, called a transducer, will be pressed against your chest and moved over your heart. The transducer produces sound waves that travel to your heart and bounce back (or "echo" back) to the transducer. These sound waves will be captured in real-time and changed into images of your heart that can be viewed on a video monitor. The images will be recorded on a computer and reviewed by your health care provider. You may be asked to change positions or hold your breath for a short time. This makes it easier to get different views or better views of your heart. In some cases, you may receive contrast through an IV in one of your veins. This can improve the quality of the pictures from your heart. The procedure may vary among health care providers and hospitals.   What can I expect after the test? You may return to your normal, everyday life, including diet, activities, and medicines, unless your health care provider tells you not to do that. Follow these instructions at home: It is up to you to get the results of your test. Ask your health care provider, or the department that is doing the test, when your results will be ready. Keep all follow-up visits. This is important. Summary An echocardiogram is a test that uses sound waves (ultrasound) to produce images of the heart. Images from an echocardiogram can provide important information about the size and shape of your heart, heart muscle function, heart valve function, and other possible heart problems. You do not need to do anything to prepare before this test. You may eat and drink normally. After the echocardiogram is completed, you may return to your normal, everyday life, unless your health care provider tells you not to do that. This information is not intended to replace advice given to you by your health care provider. Make sure you discuss any questions you have with your health care  provider. Document Revised: 04/30/2020 Document Reviewed: 04/30/2020 Elsevier Patient Education  [place]

## 2022-11-17 NOTE — Telephone Encounter (Signed)
Patient forgot to mention that she's been having some burning/tingling on the left side of her chest and going up the left side of her neck. She states she's been using heating pads and pain medicine to help with these symptoms. CB # [phone]

## 2022-11-18 NOTE — Telephone Encounter (Signed)
Spoke with pt about message - per Dr. Julien Nordmann note. Pt agreed and verbalized understanding. She will continue with planned tests and follow up.

## 2022-12-15 ENCOUNTER — Ambulatory Visit (HOSPITAL_COMMUNITY): Payer: Medicare PPO

## 2022-12-17 ENCOUNTER — Telehealth: Payer: Self-pay

## 2022-12-17 ENCOUNTER — Ambulatory Visit (HOSPITAL_COMMUNITY): Payer: Medicare PPO | Attending: Cardiology

## 2022-12-17 DIAGNOSIS — R55 Syncope and collapse: Secondary | ICD-10-CM

## 2022-12-17 DIAGNOSIS — R011 Cardiac murmur, unspecified: Secondary | ICD-10-CM

## 2022-12-17 LAB — ECHOCARDIOGRAM COMPLETE
Area-P 1/2: 3.59 cm2
S' Lateral: 1.5 cm

## 2022-12-17 NOTE — Telephone Encounter (Signed)
-----   Message from Jenean Lindau, MD sent at 12/17/2022  3:44 PM EDT ----- Appears mildly abnormal.  Septum appears thickened.  Recommend cardiac MRI for further evaluation.  Cc  primary care/referring physician Jenean Lindau, MD 12/17/2022 3:44 PM

## 2023-01-12 ENCOUNTER — Emergency Department (HOSPITAL_BASED_OUTPATIENT_CLINIC_OR_DEPARTMENT_OTHER)
Admission: EM | Admit: 2023-01-12 | Discharge: 2023-01-12 | Disposition: A | Discharge status: Home / Self Care | Payer: Medicare PPO | Attending: Emergency Medicine | Admitting: Emergency Medicine

## 2023-01-12 ENCOUNTER — Emergency Department (HOSPITAL_BASED_OUTPATIENT_CLINIC_OR_DEPARTMENT_OTHER): Payer: Medicare PPO

## 2023-01-12 ENCOUNTER — Encounter (HOSPITAL_BASED_OUTPATIENT_CLINIC_OR_DEPARTMENT_OTHER): Payer: Self-pay | Admitting: Urology

## 2023-01-12 ENCOUNTER — Other Ambulatory Visit: Payer: Self-pay

## 2023-01-12 DIAGNOSIS — Z79899 Other long term (current) drug therapy: Secondary | ICD-10-CM | POA: Diagnosis not present

## 2023-01-12 DIAGNOSIS — I10 Essential (primary) hypertension: Secondary | ICD-10-CM | POA: Diagnosis not present

## 2023-01-12 DIAGNOSIS — R0789 Other chest pain: Secondary | ICD-10-CM | POA: Insufficient documentation

## 2023-01-12 DIAGNOSIS — Z7982 Long term (current) use of aspirin: Secondary | ICD-10-CM | POA: Insufficient documentation

## 2023-01-12 DIAGNOSIS — R079 Chest pain, unspecified: Secondary | ICD-10-CM | POA: Diagnosis present

## 2023-01-12 LAB — CBC
HCT: 39.7 % (ref 36.0–46.0)
Hemoglobin: 13 g/dL (ref 12.0–15.0)
MCH: 27.1 pg (ref 26.0–34.0)
MCHC: 32.7 g/dL (ref 30.0–36.0)
MCV: 82.9 fL (ref 80.0–100.0)
Platelets: 236 10*3/uL (ref 150–400)
RBC: 4.79 MIL/uL (ref 3.87–5.11)
RDW: 14.3 % (ref 11.5–15.5)
WBC: 8 10*3/uL (ref 4.0–10.5)
nRBC: 0 % (ref 0.0–0.2)

## 2023-01-12 LAB — TROPONIN I (HIGH SENSITIVITY)
Troponin I (High Sensitivity): 4 ng/L (ref <18)
Troponin I (High Sensitivity): 4 ng/L (ref <18)

## 2023-01-12 LAB — BASIC METABOLIC PANEL
Anion gap: 7 (ref 5–15)
BUN: 11 mg/dL (ref 8–23)
CO2: 27 mmol/L (ref 22–32)
Calcium: 9 mg/dL (ref 8.9–10.3)
Chloride: 101 mmol/L (ref 98–111)
Creatinine, Ser: 0.87 mg/dL (ref 0.44–1.00)
GFR, Estimated: >60 mL/min (ref >60)
Glucose, Bld: 127 mg/dL — ABNORMAL HIGH (ref 70–99)
Potassium: 3.8 mmol/L (ref 3.5–5.1)
Sodium: 135 mmol/L (ref 135–145)

## 2023-01-12 MED ORDER — FENTANYL CITRATE PF 50 MCG/ML IJ SOSY
50.0000 ug | PREFILLED_SYRINGE | Freq: Once | INTRAMUSCULAR | Status: AC
  Administered 2023-01-12: 50 ug via INTRAVENOUS
  Filled 2023-01-12: qty 1

## 2023-01-12 NOTE — ED Notes (Signed)
Patient transported to X-ray 

## 2023-01-12 NOTE — ED Triage Notes (Signed)
Pt states sharp shooting intermittent pain to left chest that started this am  Denies and SOB, Denies N/V

## 2023-01-12 NOTE — Discharge Instructions (Signed)
Your laboratories also within normal limits today.  You schedule an appointment with your cardiologist at your earliest convenience.  If you experience worsening symptoms please return to the emergency department.

## 2023-01-12 NOTE — ED Provider Notes (Signed)
[place] [facility] Provider Note   CSN: [id number] Arrival date & time: 01/12/23  1206     History  Chief Complaint  Patient presents with   Chest Pain    Connie Gross is a 67 y.o. female.  67 y.o female with a PMH of HTN, Mild mitral regurgitation presents to the ED with a chief complaint approximately 4 hours ago.  Patient reports she was arriving at work when suddenly she began to feel this pinching stabbing sensation to the left side of her chest, reports later the pain became more constant as she was driving into the emergency department.  She has not had any episodes similar to these in the past.  She did not take any medication for improvement in symptoms.  She does report that moving makes this pain worse.  She did have a recent syncopal episode therefore she is now followed by cardiologist.  Pain radiates under her left shoulder blade and is worse when she moves her left shoulder.  No shortness of breath, no prior history of CAD, no fevers, no cough.  The history is provided by the patient.  Chest Pain Pain location:  L chest Pain quality: not stabbing   Pain radiates to:  Does not radiate Pain severity:  Moderate Onset quality:  Sudden Duration:  4 hours Timing:  Constant Progression:  Worsening Associated symptoms: no abdominal pain, no back pain, no fever, no headache, no nausea, no shortness of breath and no vomiting        Home Medications Prior to Admission medications   Medication Sig Start Date End Date Taking? Authorizing Provider  aspirin EC 81 MG EC tablet Take 1 tablet (81 mg total) by mouth daily. Patient not taking: Reported on 11/17/2022 10/20/15   Edsel Petrin, DO  cholecalciferol (VITAMIN D) 1000 UNITS tablet Take 1,000 Units by mouth daily.    [provider]  cyclobenzaprine (FLEXERIL) 5 MG tablet Take 1 tablet (5 mg total) by mouth 3 (three) times daily as needed for muscle spasms. 10/14/15   Mabe,  Latanya Maudlin, MD  latanoprost (XALATAN) 0.005 % ophthalmic solution Place 1 drop into both eyes at bedtime. 09/25/22   [provider]  Multiple Vitamin (MULTIVITAMIN) tablet Take 1 tablet by mouth daily. Reported on 10/17/2015    [provider]  omeprazole (PRILOSEC) 40 MG capsule Take 1 capsule (40 mg total) by mouth daily. Patient not taking: Reported on 11/17/2022 01/27/16   Sheliah Hatch, MD  oxyCODONE-acetaminophen (PERCOCET/ROXICET) 5-325 MG tablet Take 1 tablet by mouth every 6 (six) hours as needed for up to 7 doses for severe pain. Patient not taking: Reported on 11/17/2022 04/01/22   Cheryll Cockayne, MD  ranitidine (ZANTAC) 300 MG tablet Take 1 tablet (300 mg total) by mouth at bedtime. Please call [phone] to schedule a cholesterol follow up. Patient not taking: Reported on 11/17/2022 09/22/17   Sheliah Hatch, MD  simvastatin (ZOCOR) 10 MG tablet Take 5 mg by mouth at bedtime.    [provider]      Allergies    Prednisone    Review of Systems   Review of Systems  Constitutional:  Negative for chills and fever.  HENT:  Negative for sore throat.   Respiratory:  Negative for shortness of breath.   Cardiovascular:  Positive for chest pain.  Gastrointestinal:  Negative for abdominal pain, nausea and vomiting.  Genitourinary:  Negative for flank pain.  Musculoskeletal:  Negative for  back pain.  Neurological:  Negative for light-headedness and headaches.  All other systems reviewed and are negative.   Physical Exam Updated Vital Signs BP 125/61   Pulse (!) 59   Temp 98.5 F (36.9 C)   Resp 18   Ht 5' (1.524 m)   Wt 60.4 kg   SpO2 96%   BMI 26.01 kg/m  Physical Exam Vitals and nursing note reviewed.  Constitutional:      General: She is not in acute distress.    Appearance: She is well-developed.  HENT:     Head: Normocephalic and atraumatic.     Mouth/Throat:     Pharynx: No oropharyngeal exudate.  Eyes:     Pupils: Pupils are equal,  round, and reactive to light.  Cardiovascular:     Rate and Rhythm: Regular rhythm.     Heart sounds: Normal heart sounds.  Pulmonary:     Effort: Pulmonary effort is normal. No respiratory distress.     Breath sounds: Normal breath sounds.  Abdominal:     General: Bowel sounds are normal. There is no distension.     Palpations: Abdomen is soft.     Tenderness: There is no abdominal tenderness.  Musculoskeletal:        General: No tenderness or deformity.     Cervical back: Normal range of motion.     Right lower leg: No edema.     Left lower leg: No edema.  Skin:    General: Skin is warm and dry.  Neurological:     Mental Status: She is alert and oriented to person, place, and time.     ED Results / Procedures / Treatments   Labs (all labs ordered are listed, but only abnormal results are displayed) Labs Reviewed  BASIC METABOLIC PANEL - Abnormal; Notable for the following components:      Result Value   Glucose, Bld 127 (*)    All other components within normal limits  CBC  TROPONIN I (HIGH SENSITIVITY)  TROPONIN I (HIGH SENSITIVITY)    EKG EKG Interpretation  Date/Time:  Tuesday January 12 2023 12:12:45 EDT Ventricular Rate:  65 PR Interval:  150 QRS Duration: 84 QT Interval:  404 QTC Calculation: 420 R Axis:   63 Text Interpretation: Sinus rhythm Confirmed by Virgina Norfolk ([id number]) on 01/12/2023 12:19:31 PM  Radiology DG Chest 2 View  Result Date: 01/12/2023 CLINICAL DATA:  Chest pain EXAM: CHEST - 2 VIEW COMPARISON:  10/19/2015 FINDINGS: Cardiomediastinal silhouette and pulmonary vasculature are within normal limits. Lungs are clear. IMPRESSION: No acute cardiopulmonary process. Electronically Signed   By: Acquanetta Belling M.D.   On: 01/12/2023 12:42    Procedures Procedures    Medications Ordered in ED Medications  fentaNYL (SUBLIMAZE) injection 50 mcg (50 mcg Intravenous Given 01/12/23 1317)    ED Course/ Medical Decision Making/ A&P                              Medical Decision Making Amount and/or Complexity of Data Reviewed Labs: ordered. Radiology: ordered.  Risk Prescription drug management.   This patient presents to the ED for concern of chest pain, this involves a number of treatment options, and is a complaint that carries with it a high risk of complications and morbidity.  The differential diagnosis includes ACS, pulmonary embolism,msk.   Co morbidities: Discussed in HPI   Brief History:  See hpi.   EMR reviewed including pt PMHx,  past surgical history and past visits to ER.   See HPI for more details   Lab Tests:  I ordered and independently interpreted labs.  The pertinent results include:    I personally reviewed all laboratory work and imaging. Metabolic panel without any acute abnormality specifically kidney function within normal limits and no significant electrolyte abnormalities. CBC without leukocytosis or significant anemia.   Imaging Studies:  NAD. I personally reviewed all imaging studies and no acute abnormality found. I agree with radiology interpretation.    Cardiac Monitoring:  The patient was maintained on a cardiac monitor.  I personally viewed and interpreted the cardiac monitored which showed an underlying rhythm of: NSR EKG non-ischemic   Medicines ordered:  I ordered medication including FENTANYL  for pain control Reevaluation of the patient after these medicines showed that the patient resolved I have reviewed the patients home medicines and have made adjustments as needed  Consults:  I requested consultation with Dr. Josiah Lobo, and discussed lab and imaging findings as well as pertinent plan - they recommend: outpatient follow up with cardiology.   Reevaluation:  After the interventions noted above I re-evaluated patient and found that they have :resolved   Social Determinants of Health:  The patient's social determinants of health were a factor in the care of this  patient    Problem List / ED Course:  Patient presents to the ED with a chief complaint of left-sided chest pain which began while she arrived at work today, pinching, stabbing sensation radiating behind her left scapula.  Reports no prior similar episodes in the past.  Does have a cardiologist Dr. Tomie China, she did obtain an echo in the month of March, reports that she was supposed to follow-up with him soon with an MRI cardiac. On evaluation today patient does not appear uncomfortable, she is overall hemodynamically.  Interpretation of her blood work was within normal limits, CBC with no leukocytosis, hemoglobin is within normal limits.  BMP with no Electra derangement, creatinine levels unremarkable.  Troponin x 2 have remained flat, EKG appears nonischemic.  Chest x-ray did not show any acute findings.  She remains hemodynamically stable, receiving fentanyl for pain control with resolution in her symptoms.  Have reviewed her chart extensively, I do see that she has had this left arm pain along with left chest pain in the past.  I have some suspicion for musculoskeletal component as it is worse when we do range her left arm.  I discussed with her appropriate follow-up with primary care physician along with cardiology.  No hypoxia, no tachycardia to suggest pulmonary embolism. No  Prior history of aneurysms, no shortness of breath.  Patient shows agrees to management, return precautions discussed at length.   Dispostion:  After consideration of the diagnostic results and the patients response to treatment, I feel that the patent would benefit from continue follow-up with cardiology.  Strict precautions discussed at length.    Portions of this note were generated with Scientist, clinical (histocompatibility and immunogenetics). Dictation errors may occur despite best attempts at proofreading.   Final Clinical Impression(s) / ED Diagnoses Final diagnoses:  Atypical chest pain    Rx / DC Orders ED Discharge Orders     None          Claude Manges, PA-C 01/12/23 1641    Virgina Norfolk, DO 01/13/23 1047

## 2023-01-13 ENCOUNTER — Telehealth: Payer: Self-pay | Admitting: Cardiology

## 2023-01-13 DIAGNOSIS — R079 Chest pain, unspecified: Secondary | ICD-10-CM

## 2023-01-13 NOTE — Telephone Encounter (Signed)
Spoke with pt who states that she was seen in the ED yesterday. Spoke with Dr. Tomie China who states order a lexi.

## 2023-01-13 NOTE — Telephone Encounter (Signed)
Pt would like a callback regarding her most recent visit to the ED for chest pain and they stated she needed to see her cardiologist but pt stated she has test on 6/10 and she's not sure if provider would want to see her before that. Offered pt an appt but she declined and would like a callback from the nurse to be advised on what to do. Please advise

## 2023-01-14 ENCOUNTER — Encounter (HOSPITAL_COMMUNITY): Payer: Self-pay | Admitting: Cardiology

## 2023-01-15 ENCOUNTER — Telehealth (HOSPITAL_COMMUNITY): Payer: Self-pay | Admitting: *Deleted

## 2023-01-15 NOTE — Telephone Encounter (Signed)
Patient given detailed instructions per Myocardial Perfusion Study Information Sheet for the test on 01/19/2023 at 1:00. Patient notified to arrive 15 minutes early and that it is imperative to arrive on time for appointment to keep from having the test rescheduled.  If you need to cancel or reschedule your appointment, please call the office within 24 hours of your appointment. . Patient verbalized understanding.Connie Gross

## 2023-01-19 ENCOUNTER — Ambulatory Visit (HOSPITAL_COMMUNITY): Payer: Medicare PPO | Attending: Internal Medicine

## 2023-01-19 DIAGNOSIS — R079 Chest pain, unspecified: Secondary | ICD-10-CM

## 2023-01-19 LAB — MYOCARDIAL PERFUSION IMAGING
LV dias vol: 54 mL (ref 46–106)
LV sys vol: 19 mL
Nuc Stress EF: 65 %
Peak HR: 94 {beats}/min
Rest HR: 60 {beats}/min
Rest Nuclear Isotope Dose: 9.8 mCi
SDS: 0
SRS: 0
SSS: 0
ST Depression (mm): 0 mm
Stress Nuclear Isotope Dose: 30.5 mCi
TID: 1.13

## 2023-01-19 MED ORDER — TECHNETIUM TC 99M TETROFOSMIN IV KIT
30.5000 | PACK | Freq: Once | INTRAVENOUS | Status: AC | PRN
  Administered 2023-01-19: 30.5 via INTRAVENOUS

## 2023-01-19 MED ORDER — TECHNETIUM TC 99M TETROFOSMIN IV KIT
9.8000 | PACK | Freq: Once | INTRAVENOUS | Status: AC | PRN
  Administered 2023-01-19: 9.8 via INTRAVENOUS

## 2023-01-19 MED ORDER — REGADENOSON 0.4 MG/5ML IV SOLN
0.4000 mg | Freq: Once | INTRAVENOUS | Status: AC
  Administered 2023-01-19: 0.4 mg via INTRAVENOUS

## 2023-02-26 ENCOUNTER — Telehealth (HOSPITAL_COMMUNITY): Payer: Self-pay | Admitting: Emergency Medicine

## 2023-02-26 NOTE — Telephone Encounter (Signed)
Reaching out to patient to offer assistance regarding upcoming cardiac imaging study; pt verbalizes understanding of appt date/time, parking situation and where to check in, pre-test NPO status and medications ordered, and verified current allergies; name and call back number provided for further questions should they arise Tyris Eliot RN Navigator Cardiac Imaging [facility] [phone] office [phone] cell 

## 2023-03-01 ENCOUNTER — Other Ambulatory Visit: Payer: Self-pay | Admitting: Cardiology

## 2023-03-01 ENCOUNTER — Ambulatory Visit (HOSPITAL_COMMUNITY)
Admission: RE | Admit: 2023-03-01 | Discharge: 2023-03-01 | Disposition: A | Discharge status: Home / Self Care | Payer: Medicare PPO | Source: Ambulatory Visit | Attending: Cardiology | Admitting: Cardiology

## 2023-03-01 DIAGNOSIS — R011 Cardiac murmur, unspecified: Secondary | ICD-10-CM | POA: Insufficient documentation

## 2023-03-01 DIAGNOSIS — R55 Syncope and collapse: Secondary | ICD-10-CM | POA: Insufficient documentation

## 2023-03-01 MED ORDER — GADOBUTROL 1 MMOL/ML IV SOLN
6.0000 mL | Freq: Once | INTRAVENOUS | Status: AC | PRN
  Administered 2023-03-01: 6 mL via INTRAVENOUS

## 2023-03-09 ENCOUNTER — Other Ambulatory Visit (HOSPITAL_COMMUNITY): Payer: Medicare PPO

## 2023-03-09 DIAGNOSIS — M545 Low back pain, unspecified: Secondary | ICD-10-CM

## 2023-04-13 ENCOUNTER — Telehealth: Payer: Self-pay

## 2023-04-13 DIAGNOSIS — R55 Syncope and collapse: Secondary | ICD-10-CM

## 2023-04-13 NOTE — Telephone Encounter (Signed)
-----   Message from Aundra Dubin Revankar sent at 04/08/2023 10:34 AM EDT ----- I have tried to call her multiple times with no success.  Please schedule her for an appointment to discuss this.  I would like to get a 2-week Zio patch on her meanwhile.  Copy primary Garwin Brothers, MD 04/08/2023 10:34 AM

## 2023-04-16 ENCOUNTER — Ambulatory Visit: Payer: Medicare PPO

## 2023-04-16 ENCOUNTER — Telehealth: Payer: Self-pay

## 2023-04-16 DIAGNOSIS — R55 Syncope and collapse: Secondary | ICD-10-CM | POA: Diagnosis not present

## 2023-04-16 NOTE — Telephone Encounter (Signed)
14 Day Zio applied per Dr. Kem Parkinson. Neale Burly note.

## 2023-04-30 ENCOUNTER — Ambulatory Visit: Payer: Medicare PPO | Admitting: Cardiology

## 2023-05-17 ENCOUNTER — Other Ambulatory Visit: Payer: Self-pay

## 2023-05-19 ENCOUNTER — Ambulatory Visit: Payer: Medicare PPO | Attending: Cardiology | Admitting: Cardiology

## 2024-01-19 ENCOUNTER — Encounter: Payer: Self-pay | Admitting: Cardiology

## 2024-01-19 ENCOUNTER — Ambulatory Visit: Attending: Cardiology | Admitting: Cardiology

## 2024-01-19 VITALS — BP 140/72 | HR 84 | Ht 60.0 in | Wt 130.8 lb

## 2024-01-19 DIAGNOSIS — I10 Essential (primary) hypertension: Secondary | ICD-10-CM

## 2024-01-19 DIAGNOSIS — R079 Chest pain, unspecified: Secondary | ICD-10-CM | POA: Diagnosis not present

## 2024-01-19 DIAGNOSIS — R011 Cardiac murmur, unspecified: Secondary | ICD-10-CM | POA: Diagnosis not present

## 2024-01-19 DIAGNOSIS — I340 Nonrheumatic mitral (valve) insufficiency: Secondary | ICD-10-CM | POA: Diagnosis not present
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
## 2024-01-19 NOTE — Patient Instructions (Signed)
 Medication Instructions:  Your physician recommends that you continue on your current medications as directed. Please refer to the Current Medication list given to you today.  *If you need a refill on your cardiac medications before your next appointment, please call your pharmacy*   Lab Work: None Ordered If you have labs (blood work) drawn today and your tests are completely normal, you will receive your results only by: MyChart Message (if you have MyChart) OR A paper copy in the mail If you have any lab test that is abnormal or we need to change your treatment, we will call you to review the results.   Testing/Procedures: None Ordered   Follow-Up: At [facility], you and your health needs are our priority.  As part of our continuing mission to provide you with exceptional heart care, we have created designated Provider Care Teams.  These Care Teams include your primary Cardiologist (physician) and Advanced Practice Providers (APPs -  Physician Assistants and Nurse Practitioners) who all work together to provide you with the care you need, when you need it.  We recommend signing up for the patient portal called "MyChart".  Sign up information is provided on this After Visit Summary.  MyChart is used to connect with patients for Virtual Visits (Telemedicine).  Patients are able to view lab/test results, encounter notes, upcoming appointments, etc.  Non-urgent messages can be sent to your provider as well.   To learn more about what you can do with MyChart, go to [email or web address].    Your next appointment:   12 month follow up  NA

## 2024-01-21 ENCOUNTER — Other Ambulatory Visit: Payer: Self-pay

## 2024-01-21 ENCOUNTER — Emergency Department (HOSPITAL_BASED_OUTPATIENT_CLINIC_OR_DEPARTMENT_OTHER)
Admission: EM | Admit: 2024-01-21 | Discharge: 2024-01-21 | Disposition: A | Discharge status: Home / Self Care | Attending: Emergency Medicine | Admitting: Emergency Medicine

## 2024-01-21 ENCOUNTER — Emergency Department (HOSPITAL_BASED_OUTPATIENT_CLINIC_OR_DEPARTMENT_OTHER)

## 2024-01-21 ENCOUNTER — Encounter (HOSPITAL_BASED_OUTPATIENT_CLINIC_OR_DEPARTMENT_OTHER): Payer: Self-pay | Admitting: Emergency Medicine

## 2024-01-21 DIAGNOSIS — I10 Essential (primary) hypertension: Secondary | ICD-10-CM | POA: Insufficient documentation

## 2024-01-21 DIAGNOSIS — Z79899 Other long term (current) drug therapy: Secondary | ICD-10-CM | POA: Diagnosis not present

## 2024-01-21 DIAGNOSIS — S161XXA Strain of muscle, fascia and tendon at neck level, initial encounter: Secondary | ICD-10-CM | POA: Insufficient documentation

## 2024-01-21 DIAGNOSIS — M542 Cervicalgia: Secondary | ICD-10-CM | POA: Diagnosis present

## 2024-01-21 DIAGNOSIS — Z7982 Long term (current) use of aspirin: Secondary | ICD-10-CM | POA: Insufficient documentation

## 2024-01-21 DIAGNOSIS — X58XXXA Exposure to other specified factors, initial encounter: Secondary | ICD-10-CM | POA: Diagnosis not present

## 2024-01-21 MED ORDER — LIDOCAINE 5 % EX PTCH
1.0000 | MEDICATED_PATCH | CUTANEOUS | Status: DC
  Administered 2024-01-21: 1 via TRANSDERMAL
  Filled 2024-01-21: qty 1

## 2024-01-21 MED ORDER — CYCLOBENZAPRINE HCL 5 MG PO TABS: 2.5000 mg | ORAL_TABLET | Freq: Two times a day (BID) | ORAL | 0 refills | Status: DC | PRN

## 2024-01-21 MED ORDER — KETOROLAC TROMETHAMINE 30 MG/ML IJ SOLN
30.0000 mg | Freq: Once | INTRAMUSCULAR | Status: AC
  Administered 2024-01-21: 30 mg via INTRAMUSCULAR
  Filled 2024-01-21: qty 1

## 2024-01-21 MED ORDER — CELECOXIB 200 MG PO CAPS
200.0000 mg | ORAL_CAPSULE | Freq: Two times a day (BID) | ORAL | 0 refills | Status: AC | PRN
Start: 2024-01-21 — End: ?

## 2024-01-21 NOTE — ED Provider Notes (Signed)
 [place] [facility] Provider Note   CSN: [id number] Arrival date & time: 01/21/24  1329     History  Chief Complaint  Patient presents with   Neck Pain    Connie Gross is a 68 y.o. female.   Neck Pain   68 year old female presents emergency department with complaints of neck pain.  Patient reports having neck pain for the past 3 days or so.  States that she initially noticed that upon waking up.  Has been trying heat/ice as well as Motrin  with some improvement of symptoms.  Presents emergency department for further assessment.  Denies any falls/traumas, weakness/sensory deficits in the lower extremities.  Denies any history of IV drug use, no malignancy, no prolonged corticosteroid use, fever.  Past medical history significant for glaucoma, hyperlipidemia, hypertension, GERD, lumbar radiculopathy, mild mitral regurgitation  Home Medications Prior to Admission medications   Medication Sig Start Date End Date Taking? Authorizing Provider  celecoxib (CELEBREX) 200 MG capsule Take 1 capsule (200 mg total) by mouth 2 (two) times daily as needed. 01/21/24  Yes Neil Balls A, PA  cyclobenzaprine  (FLEXERIL ) 5 MG tablet Take 0.5 tablets (2.5 mg total) by mouth 2 (two) times daily as needed for muscle spasms. 01/21/24  Yes Neil Balls A, PA  aspirin  EC 81 MG EC tablet Take 1 tablet (81 mg total) by mouth daily. 10/20/15   Mikhail, Maryann, DO  cholecalciferol (VITAMIN D) 1000 UNITS tablet Take 1,000 Units by mouth daily.    [provider]  latanoprost (XALATAN) 0.005 % ophthalmic solution Place 1 drop into both eyes at bedtime. 09/25/22   [provider]  metoprolol succinate (TOPROL-XL) 25 MG 24 hr tablet Take 25 mg by mouth daily. 12/29/22   [provider]  Multiple Vitamin (MULTIVITAMIN) tablet Take 1 tablet by mouth daily. Reported on 10/17/2015    [provider]  ranitidine  (ZANTAC ) 300 MG tablet Take 1 tablet (300  mg total) by mouth at bedtime. Please call [phone] to schedule a cholesterol follow up. Patient taking differently: Take 300 mg by mouth daily as needed for heartburn. Please call [phone] to schedule a cholesterol follow up. 09/22/17   Tabori, Katherine E, MD  simvastatin  (ZOCOR ) 10 MG tablet Take 5 mg by mouth at bedtime.    [provider]  timolol (TIMOPTIC) 0.5 % ophthalmic solution Place 1 drop into both eyes daily. 07/06/23   [provider]      Allergies    Prednisone     Review of Systems   Review of Systems  Musculoskeletal:  Positive for neck pain.  All other systems reviewed and are negative.   Physical Exam Updated Vital Signs BP (!) 150/72 (BP Location: Left Arm)   Pulse 71   Temp 98.3 F (36.8 C) (Oral)   Resp 16   Ht 5' (1.524 m)   Wt 59 kg   SpO2 100%   BMI 25.39 kg/m  Physical Exam Vitals and nursing note reviewed.  Constitutional:      General: She is not in acute distress.    Appearance: She is well-developed.  HENT:     Head: Normocephalic and atraumatic.  Eyes:     Conjunctiva/sclera: Conjunctivae normal.  Cardiovascular:     Rate and Rhythm: Normal rate and regular rhythm.     Heart sounds: No murmur heard. Pulmonary:     Effort: Pulmonary effort is normal. No respiratory distress.     Breath sounds: Normal breath sounds.  Abdominal:  Palpations: Abdomen is soft.     Tenderness: There is no abdominal tenderness.  Musculoskeletal:        General: No swelling.     Cervical back: Normal range of motion and neck supple. No rigidity.     Comments: Midline tenderness C2-C3 cervical spine without step-off or deformity.  Paraspinal tenderness right-sided cervical region with tenderness along right trapezial ridge.  Muscular strength 5 out of 5 bilateral upper extremities.  No sensory deficits on major nerve distributions of upper EXTR.  Radial pulses 2+ bilaterally.    Skin:    General: Skin is warm and dry.     Capillary Refill:  Capillary refill takes less than 2 seconds.  Neurological:     Mental Status: She is alert.  Psychiatric:        Mood and Affect: Mood normal.     ED Results / Procedures / Treatments   Labs (all labs ordered are listed, but only abnormal results are displayed) Labs Reviewed - No data to display  EKG None  Radiology CT Cervical Spine Wo Contrast Result Date: 01/21/2024 CLINICAL DATA:  Right-sided neck pain. EXAM: CT CERVICAL SPINE WITHOUT CONTRAST TECHNIQUE: Multidetector CT imaging of the cervical spine was performed without intravenous contrast. Multiplanar CT image reconstructions were also generated. RADIATION DOSE REDUCTION: This exam was performed according to the departmental dose-optimization program which includes automated exposure control, adjustment of the mA and/or kV according to patient size and/or use of iterative reconstruction technique. COMPARISON:  None Available. FINDINGS: Alignment: Normal. Skull base and vertebrae: No acute fracture. No primary bone lesion or focal pathologic process. Soft tissues and spinal canal: No prevertebral fluid or swelling. No visible canal hematoma. Disc levels: There is disc space narrowing and endplate osteophyte formation throughout the cervical spine most significant at C5-C6. There is mild bilateral neural foraminal stenosis at C5-C6 secondary to uncovertebral spurring. There is disc bulge at C3-C4 causing mild central canal stenosis. Upper chest: Negative. Other: None. IMPRESSION: 1. No acute fracture or traumatic subluxation of the cervical spine. 2. Multilevel degenerative changes of the cervical spine most significant at C5-C6. Electronically Signed   By: Tyron Gallon M.D.   On: 01/21/2024 15:19    Procedures Procedures    Medications Ordered in ED Medications  lidocaine  (LIDODERM ) 5 % 1 patch (1 patch Transdermal Patch Applied 01/21/24 1401)  ketorolac  (TORADOL ) 30 MG/ML injection 30 mg (30 mg Intramuscular Given 01/21/24 1358)     ED Course/ Medical Decision Making/ A&P                                 Medical Decision Making Amount and/or Complexity of Data Reviewed Radiology: ordered.  Risk Prescription drug management.   This patient presents to the ED for concern of neck pain, this involves an extensive number of treatment options, and is a complaint that carries with it a high risk of complications and morbidity.  The differential diagnosis includes fracture, strain/sprain, dislocation, torticollis, carotid artery central tubular dissection, meningitis, other   Co morbidities that complicate the patient evaluation  See HPI   Additional history obtained:  Additional history obtained from EMR External records from outside source obtained and reviewed including hospital record   Lab Tests:  N/a   Imaging Studies ordered:  I ordered imaging studies including CT cervical spine I independently visualized and interpreted imaging which showed no acute fracture or traumatic subluxation of cervical spine.  Multilevel degenerative changes with most prominent at C5-C6. I agree with the radiologist interpretation   Cardiac Monitoring: / EKG:  N/a   Consultations Obtained:  N/a   Problem List / ED Course / Critical interventions / Medication management  Neck pain I ordered medication including toradol     Reevaluation of the patient after these medicines showed that the patient improved I have reviewed the patients home medicines and have made adjustments as needed   Social Determinants of Health:  Denies tobacco, licit drug use.   Test / Admission - Considered:  Neck pain Vitals signs significant for hypertension blood pressure 150/72. Otherwise within normal range and stable throughout visit. Imaging studies significant for: See above 68 year old female presents emergency department with complaints of neck pain.  Patient reports having neck pain for the past 3 days or so.  States  that she initially noticed that upon waking up.  Has been trying heat/ice as well as Motrin  with some improvement of symptoms.  Presents emergency department for further assessment.  Denies any falls/traumas, weakness/sensory deficits in the lower extremities.  Denies any history of IV drug use, no malignancy, no prolonged corticosteroid use, fever. On exam, slight midline tenderness cervical spine with mild tenderness paraspinally on the right obvious spasming when compared contralaterally.  No pulse deficits such as ischemic limb.  Overlying skin changes concerning for second infectious process.  Patient without exertional worsening symptoms.  CT imaging obtained which was negative for any acute abnormality but with degenerative changes as above.  Suspect muscular etiology of patient's symptoms.  Treated with NSAID while in the ED and did note significant improvement.  Will plan to send home with NSAIDs, recommend rehab exercises and follow-up with PCP.  Treatment plan discussed with patient and she acknowledged understanding was agreeable to said plan.  Patient overall well-appearing, afebrile in no acute distress. Worrisome signs and symptoms were discussed with the patient, and the patient acknowledged understanding to return to the ED if noticed. Patient was stable upon discharge.          Final Clinical Impression(s) / ED Diagnoses Final diagnoses:  Acute strain of neck muscle, initial encounter    Rx / DC Orders ED Discharge Orders     None         [place] Butter, [place] 01/21/24 1539    Roberts Ching, MD 01/26/24 1739

## 2024-01-21 NOTE — ED Triage Notes (Signed)
 Pt POV steady gait- c/o neck pain x3 days, worse today.    Hx of same. Denies paraesthesia. Denies known injury. Denies fever.

## 2024-01-21 NOTE — Discharge Instructions (Addendum)
 As discussed, CT scan of your neck shows arthritis in your neck with most noted at C5-C6.  Will place you on anti-inflammatory called Celebrex to 20 twice daily as needed.  Will also send in with muscle relaxer.  If this medicine causes confusion, altered mental status, increased tiredness, please do not continue taking it.  Recommend continued stretching exercises as we discussed.  Please do not hesitate to return if the worrisome signs and symptoms we discussed become apparent.
